# Patient Record
Sex: Male | Born: 1960
Health system: Southern US, Community
[De-identification: ages and names within clinical notes are randomized; demographics above are authoritative.]

## PROBLEM LIST (undated history)

## (undated) DIAGNOSIS — E782 Mixed hyperlipidemia: Secondary | ICD-10-CM

## (undated) DIAGNOSIS — A630 Anogenital (venereal) warts: Secondary | ICD-10-CM

## (undated) DIAGNOSIS — M51369 Other intervertebral disc degeneration, lumbar region without mention of lumbar back pain or lower extremity pain: Secondary | ICD-10-CM

## (undated) DIAGNOSIS — M5136 Other intervertebral disc degeneration, lumbar region: Secondary | ICD-10-CM

## (undated) DIAGNOSIS — Z973 Presence of spectacles and contact lenses: Secondary | ICD-10-CM

## (undated) DIAGNOSIS — E291 Testicular hypofunction: Secondary | ICD-10-CM

## (undated) DIAGNOSIS — I10 Essential (primary) hypertension: Secondary | ICD-10-CM

## (undated) DIAGNOSIS — E559 Vitamin D deficiency, unspecified: Secondary | ICD-10-CM

## (undated) HISTORY — PX: INGUINAL HERNIA REPAIR: SUR1180

## (undated) HISTORY — DX: Essential (primary) hypertension: I10

---

## 2001-10-15 ENCOUNTER — Encounter: Payer: Self-pay | Admitting: Internal Medicine

## 2001-10-15 ENCOUNTER — Ambulatory Visit (HOSPITAL_COMMUNITY): Admission: RE | Admit: 2001-10-15 | Discharge: 2001-10-15 | Payer: Self-pay | Admitting: Internal Medicine

## 2001-12-05 ENCOUNTER — Encounter: Admission: RE | Admit: 2001-12-05 | Discharge: 2001-12-05 | Payer: Self-pay | Admitting: Neurosurgery

## 2001-12-18 ENCOUNTER — Encounter: Admission: RE | Admit: 2001-12-18 | Discharge: 2001-12-18 | Payer: Self-pay | Admitting: Neurosurgery

## 2003-06-12 ENCOUNTER — Ambulatory Visit (HOSPITAL_COMMUNITY): Admission: RE | Admit: 2003-06-12 | Discharge: 2003-06-12 | Payer: Self-pay | Admitting: Internal Medicine

## 2004-05-14 ENCOUNTER — Encounter: Admission: RE | Admit: 2004-05-14 | Discharge: 2004-05-14 | Payer: Self-pay | Admitting: Neurosurgery

## 2004-07-09 ENCOUNTER — Encounter: Admission: RE | Admit: 2004-07-09 | Discharge: 2004-07-09 | Payer: Self-pay | Admitting: Neurosurgery

## 2004-11-10 ENCOUNTER — Encounter: Admission: RE | Admit: 2004-11-10 | Discharge: 2004-11-10 | Payer: Self-pay | Admitting: Neurosurgery

## 2004-11-30 ENCOUNTER — Encounter: Admission: RE | Admit: 2004-11-30 | Discharge: 2004-11-30 | Payer: Self-pay | Admitting: Neurosurgery

## 2004-12-29 ENCOUNTER — Ambulatory Visit (HOSPITAL_COMMUNITY): Admission: RE | Admit: 2004-12-29 | Discharge: 2004-12-30 | Payer: Self-pay | Admitting: Neurosurgery

## 2004-12-29 HISTORY — PX: LUMBAR MICRODISCECTOMY: SHX99

## 2007-01-08 ENCOUNTER — Ambulatory Visit (HOSPITAL_COMMUNITY): Admission: RE | Admit: 2007-01-08 | Discharge: 2007-01-08 | Payer: Self-pay | Admitting: Internal Medicine

## 2007-10-06 IMAGING — US US ABDOMEN COMPLETE
1 series · 14 of 25 positions shown · non-contrast
Comparison: none

CLINICAL DATA: Elevated liver function tests.  
 ABDOMEN ULTRASOUND:
TECHNIQUE: Complete abdominal ultrasound examination was performed including evaluation of the liver, gallbladder, bile ducts, pancreas, kidneys, spleen, IVC, and abdominal aorta.

[Series 1: us abdomen complete · 14 of 74 slices shown]
[im 1/74]
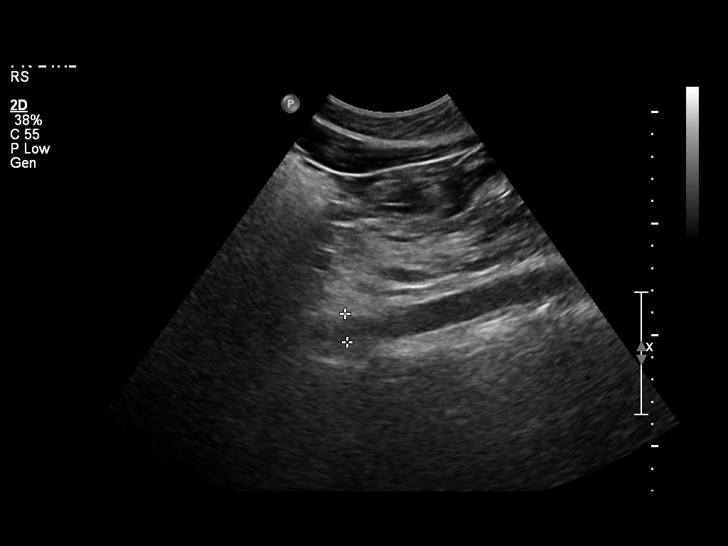
[im 7/74]
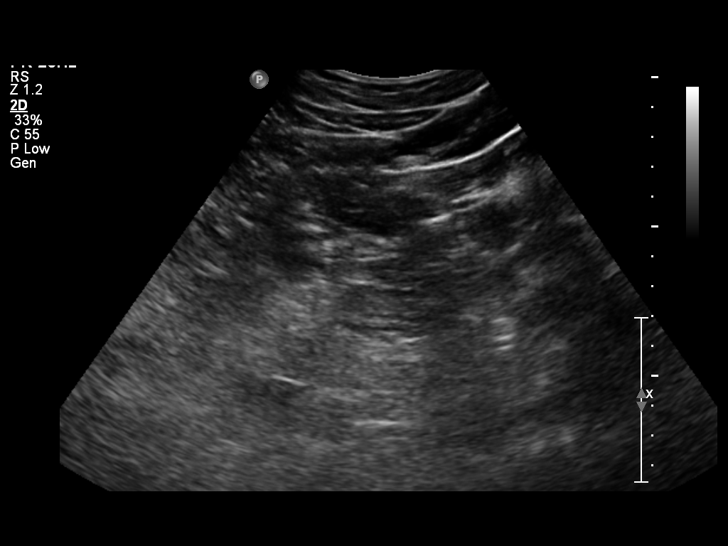
[im 13/74]
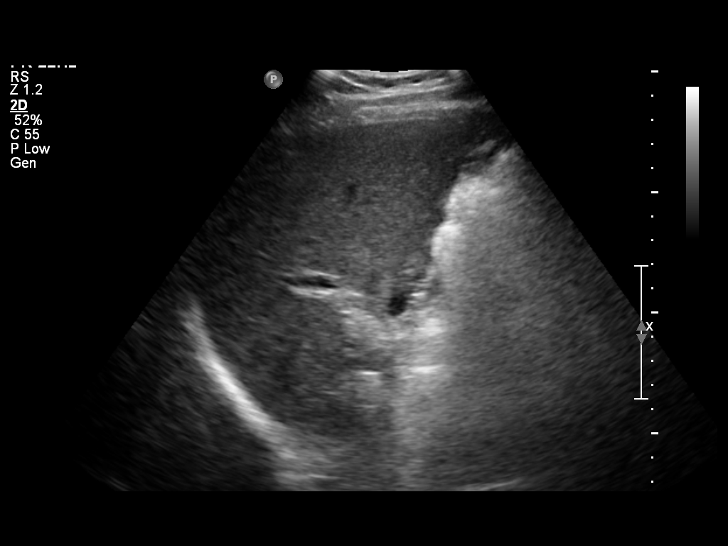
[im 19/74]
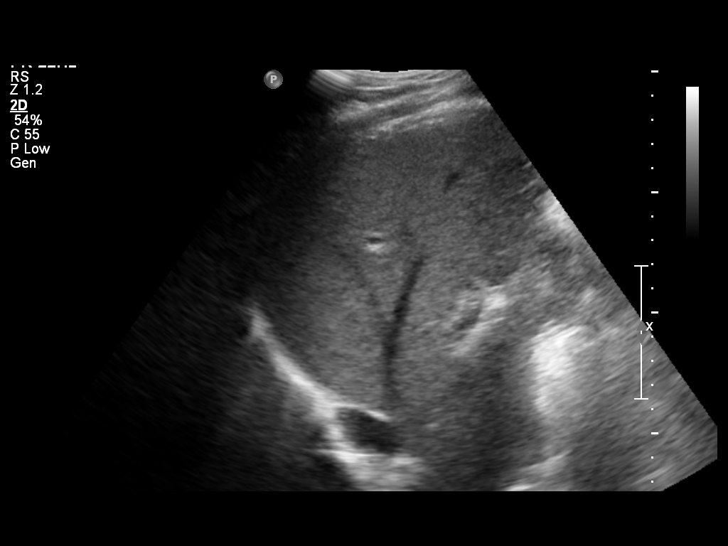
[im 25/74]
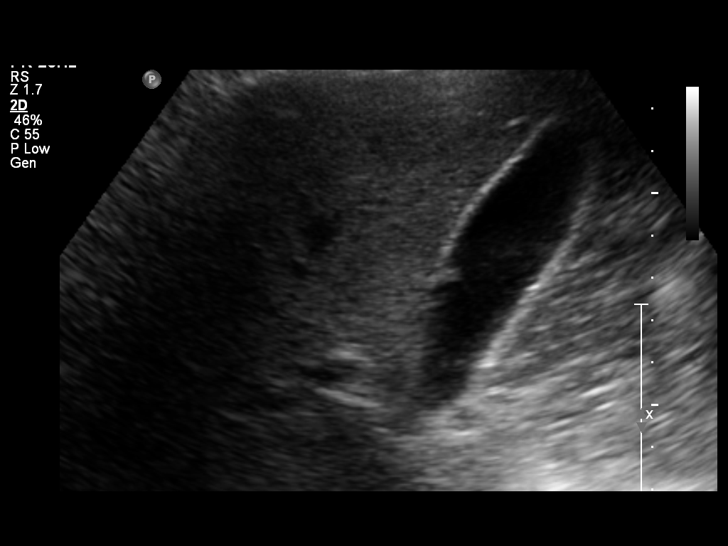
[im 28/74]
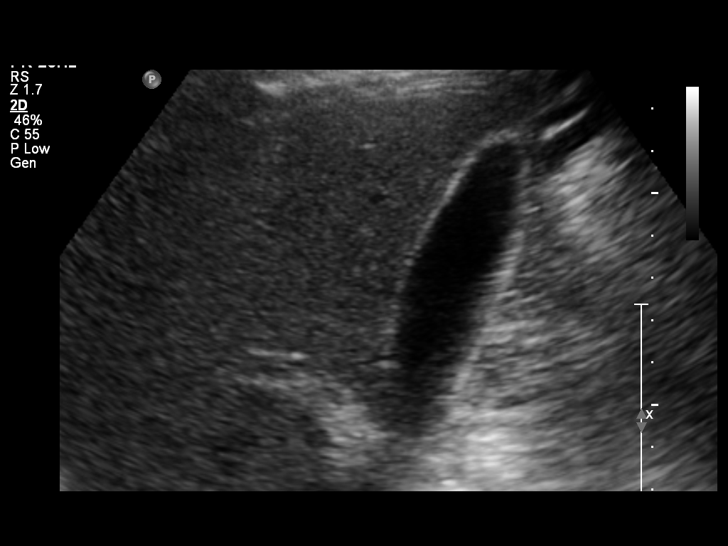
[im 34/74]
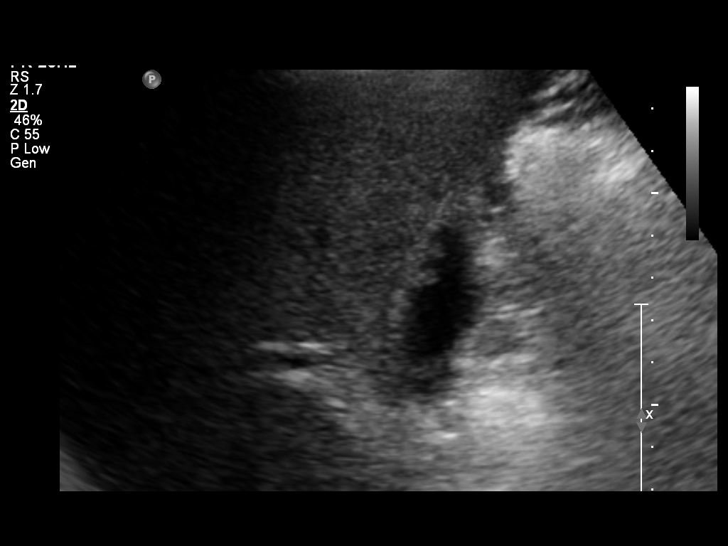
[im 40/74]
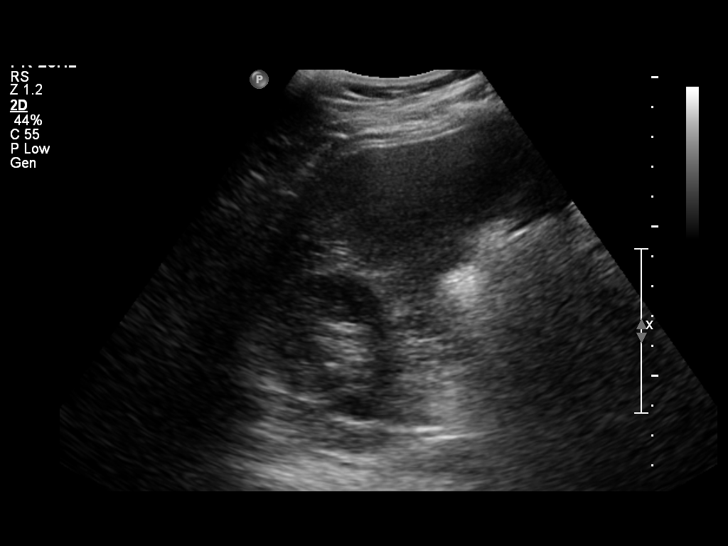
[im 46/74]
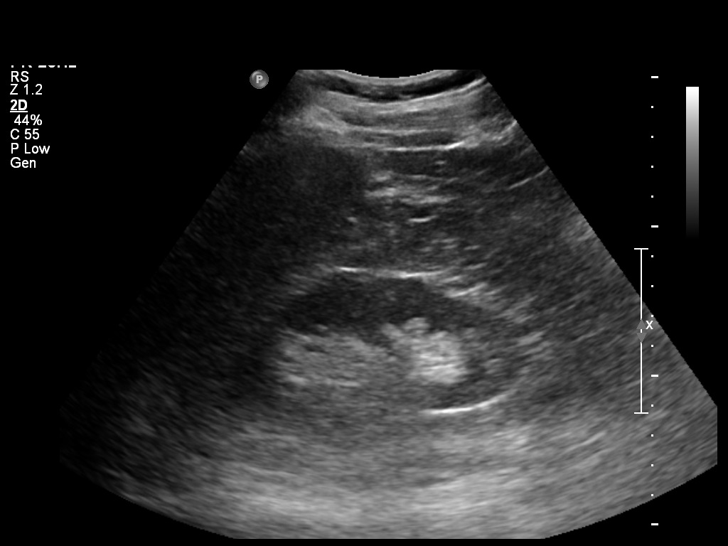
[im 49/74]
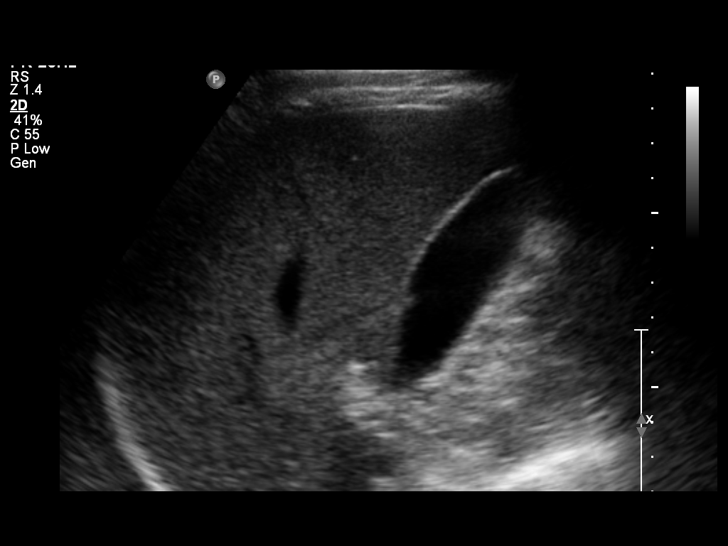
[im 55/74]
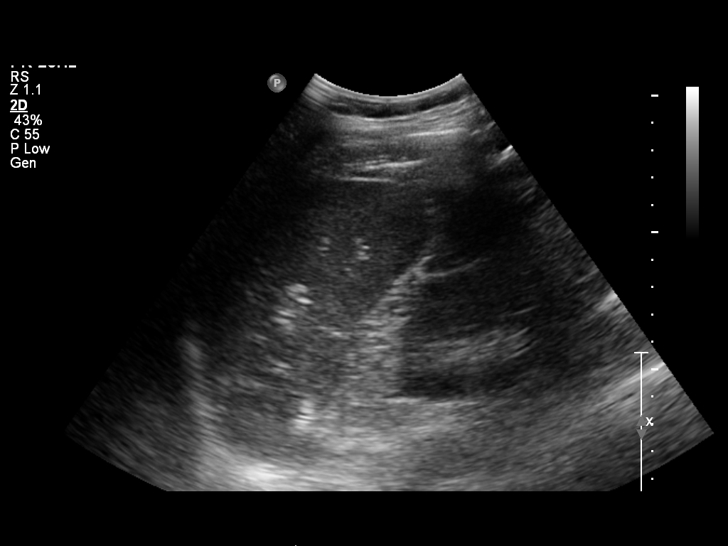
[im 61/74]
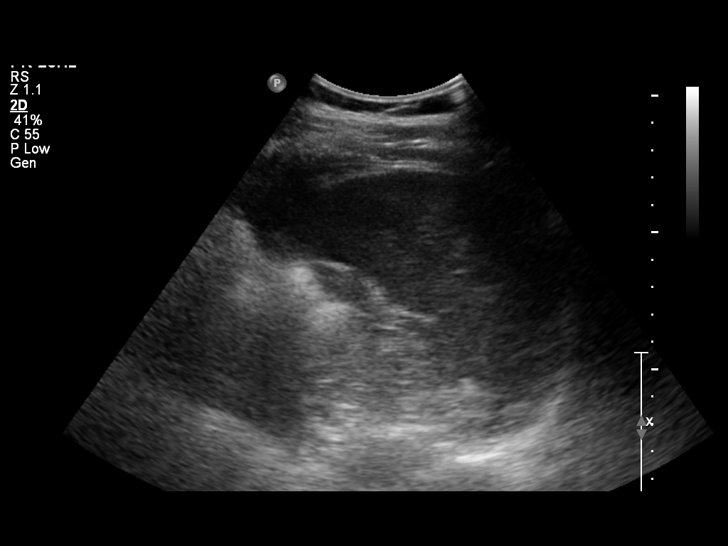
[im 67/74]
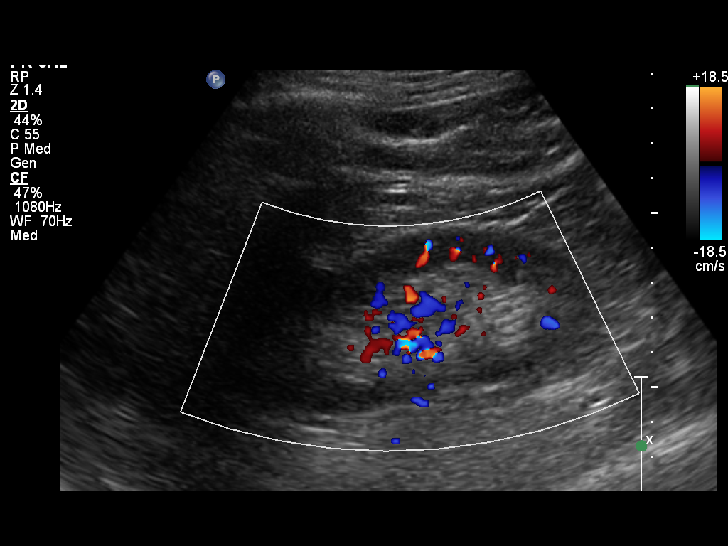
[im 74/74]
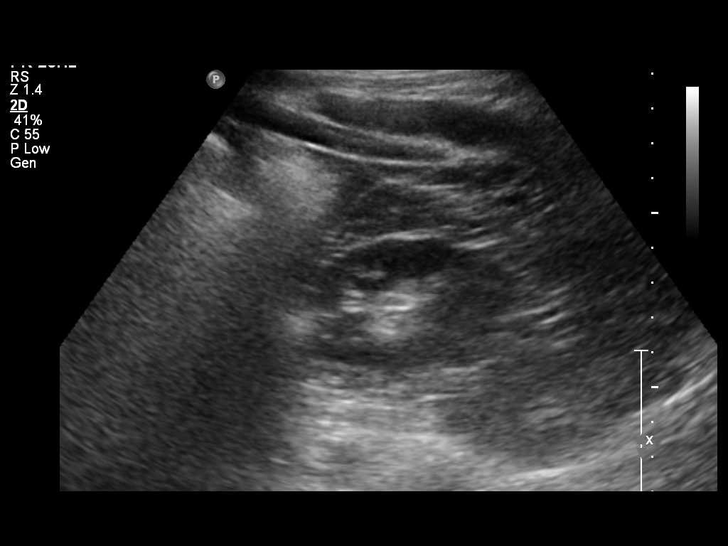

[14 of 25 positions shown; findings below may reference images not displayed]

FINDINGS: There is no evidence of gallstones or gallbladder wall thickening. Two tiny cholesterol polyps are seen along the nondependent gallbladder wall measuring 2-3 mm.  There is no evidence of biliary ductal dilatation with the common bile duct measuring 2 mm.  The liver is normal in parenchymal echogenicity and no focal liver lesions are identified.  
 The visualized portion of the IVC is unremarkable but the pancreas is obscured by overlying bowel gas.  There is no evidence of splenomegaly.  Both kidneys are normal in size and appearance and there is no evidence of hydronephrosis.  There is no evidence of abdominal aortic aneurysm or ascites.
IMPRESSION: 1.  Normal sonographic appearance of the liver.  No evidence of biliary dilatation.
 2.  No evidence of gallstones.

## 2010-08-21 ENCOUNTER — Encounter: Payer: Self-pay | Admitting: Internal Medicine

## 2010-08-23 ENCOUNTER — Encounter: Payer: Self-pay | Admitting: Internal Medicine

## 2010-12-17 NOTE — Op Note (Signed)
Philip Montgomery, Philip Montgomery                  ACCOUNT NO.:  [id number]   MEDICAL RECORD NO.:  [id number]          PATIENT TYPE:  OIB   LOCATION:  3001                         FACILITY:  [facility]   PHYSICIAN:  Cristi Loron, M.D.DATE OF BIRTH:  [birth year]   DATE OF PROCEDURE:  12/29/2004  DATE OF DISCHARGE:                                 OPERATIVE REPORT   BRIEF HISTORY:  The patient is a 50 year old white male who suffers from  chronic back pain, more recently he has developed severe right leg pain  consistent with a right L5 radiculopathy.  He was worked up with a lumbar  MRI which demonstrated herniated disc at L4-L5 on the right.  I discussed  the various treatments with the patient including surgery.  The patient has  weighed the risks, benefits, and alternatives of surgery and decided to  proceed with right L4-L5 microdiscectomy.   PREOPERATIVE DIAGNOSIS:  Right L4-L5 herniated nucleus pulposus,  degenerative disease, lumbar radiculopathy, lumbago, lumbar stenosis.   POSTOPERATIVE DIAGNOSIS:  Right L4-L5 herniated nucleus pulposus,  degenerative disease, lumbar radiculopathy, lumbago, lumbar stenosis.   PROCEDURE:  Right L4-L5 microdiscectomy using microdissection.   SURGEON:  Cristi Loron, M.D.   ASSISTANT:  Payton Doughty, M.D.   ANESTHESIA:  General endotracheal anesthesia.   ESTIMATED BLOOD LOSS:  Minimal.   SPECIMENS:  None.   DRAINS:  None.   COMPLICATIONS:  None.   DESCRIPTION OF PROCEDURE:  The patient was brought to the operating room by  the anesthesia team.  General endotracheal anesthesia was induced.  The  patient was then turned to the prone position on the Wilson frame.  His  lumbosacral  region was then prepared with Betadine scrub and Betadine  solution.  Sterile drapes were applied.  I then injected the area to be  incised with Marcaine with epinephrine solution.  I used a scalpel to make a  linear midline incision over the L4-L5 interspace.  I used  electrocautery to  perform a subperiosteal dissection exposing the right spinous process and  lamina of L4 and L5.  I obtained interoperative radiograph to confirm our  location and then inserted a McCullough retractor for exposure.   We then brought the operation microscope onto the field and under  magnification and illumination, we completed the  microdissection/decompression.  We used the high speed drill to perform a  right L4 laminotomy.  We widened the laminotomy with a Kerrison punch and  removed the right L4-L5 ligamentum flavum.  We then performed a foraminotomy  about the right L5 nerve root.  We then used the microdissection to free up  the nerve root from the epidural tissue and then Dr. Channing Mutters gently retracted  the nerve root and thecal sac medially.  This exposed a free fragment disc  herniation.  We freed up the disc herniation from epidural tissue and  removed it with pituitary forceps.  I inspected the intervertebral disc at  L4-L5.  There was considerable bulging and ventral compression of the thecal  sac at the disc space.  We, therefore, incised the annulus  fibrosis and  performed a partial intervertebral discectomy using pituitary forceps and  Scoville curets.  After we were satisfied with intervertebral discectomy, I  used the osteophyte tools to remove some redundant ligament at the vertebral  endplates at L4-L5 further decompressing the L5 nerve root and the thecal  sac.  I then obtained stringent hemostasis using bipolar electrocautery.  We  palpated along the ventral surface of the thecal sac and the exit route L5  nerve root and noted the neural structures were well decompressed.  We then  copiously irrigated the wound out with Bacitracin solution and removed the  solution.  We then removed the [facility] retractor.  We then reapproximated  the patient's thoracolumbar fascia with interrupted #1 Vicryl suture, the  subcutaneous tissue with interrupted 2-0 Vicryl  suture, and the skin with  Steri-Strips and Benzoin.  The wound was then coated with Bacitracin  ointment.  A sterile dressing was applied, the drapes were removed.  The  patient was subsequently returned to the supine position where he was  extubated by the anesthesia team and transported to the post anesthesia care  unit in stable condition.  All sponge, instrument and needle counts were  correct at the end of the case.       JDJ/MEDQ  D:  12/29/2004  T:  12/29/2004  Job:  [id number]

## 2014-08-13 ENCOUNTER — Ambulatory Visit (INDEPENDENT_AMBULATORY_CARE_PROVIDER_SITE_OTHER): Payer: BLUE CROSS/BLUE SHIELD | Admitting: Physician Assistant

## 2014-08-13 ENCOUNTER — Encounter: Payer: Self-pay | Admitting: Physician Assistant

## 2014-08-13 VITALS — BP 110/68 | HR 76 | Temp 97.7°F | Resp 16 | Ht 66.0 in | Wt 171.0 lb

## 2014-08-13 DIAGNOSIS — N182 Chronic kidney disease, stage 2 (mild): Secondary | ICD-10-CM | POA: Insufficient documentation

## 2014-08-13 DIAGNOSIS — N181 Chronic kidney disease, stage 1: Secondary | ICD-10-CM

## 2014-08-13 DIAGNOSIS — E782 Mixed hyperlipidemia: Secondary | ICD-10-CM | POA: Insufficient documentation

## 2014-08-13 DIAGNOSIS — E785 Hyperlipidemia, unspecified: Secondary | ICD-10-CM

## 2014-08-13 DIAGNOSIS — I10 Essential (primary) hypertension: Secondary | ICD-10-CM

## 2014-08-13 DIAGNOSIS — G4700 Insomnia, unspecified: Secondary | ICD-10-CM

## 2014-08-13 DIAGNOSIS — B009 Herpesviral infection, unspecified: Secondary | ICD-10-CM

## 2014-08-13 MED ORDER — HYDROCORTISONE ACETATE 25 MG RE SUPP: 25.0000 mg | Freq: Two times a day (BID) | RECTAL | Status: DC

## 2014-08-13 NOTE — Patient Instructions (Addendum)
Benefiber is good for constipation/diarrhea/irritable bowel syndrome, it helps with weight loss and can help lower your bad cholesterol. Please do 1-2 TBSP in the morning in water, coffee, or tea. It can take up to a month before you can see a difference with your bowel movements. It is cheapest from [employer], [employer], [employer].   Your LDL is not in range. Your LDL is the bad cholesterol that can lead to heart attack and stroke. To lower your number you can decrease your fatty foods, red meat, cheese, milk and increase fiber like whole grains and veggies. You can also add a fiber supplement like Metamucil or Benefiber.   Call Dr. Kinnie ScalesMedoff to ask about colonoscopy Phone: [phone]      Bad carbs also include fruit juice, alcohol, and sweet tea. These are empty calories that do not signal to your brain that you are full.   Please remember the good carbs are still carbs which convert into sugar. So please measure them out no more than 1/2-1 cup of rice, oatmeal, pasta, and beans  Veggies are however free foods! Pile them on.   Not all fruit is created equal. Please see the list below, the fruit at the bottom is higher in sugars than the fruit at the top. Please avoid all dried fruits.

## 2014-08-13 NOTE — Progress Notes (Signed)
Assessment and Plan:   Hypertension: Continue medication, monitor blood pressure at home. Continue DASH diet.  Reminder to go to the ER if any CP, SOB, nausea, dizziness, severe HA, changes vision/speech, left arm numbness and tingling, and jaw pain. Cholesterol: Continue diet and exercise. Check cholesterol.   Pre-diabetes-Continue diet and exercise. Check A1C Vitamin D Def- check level and continue medications.  Insomnia- cont ambien,good sleep hygiene discussed, increase day time activity Constipation- Increase fiber/ water intake, decrease caffeine, increase activity level, can add benefiber until BM soft, supp sent in. Laboratory tests per orders. Please go to the hospital if you have severe abdominal pain, vomiting, fever, CP, SOB. Follow up Dr. Kinnie ScalesMedoff.  ? BPH symptoms- does not want meds at this time, will recheck PSA in Nov.   Continue diet and meds as discussed. Will follow up in 3 months to recheck labs.    HPI 54 y.o. male  presents for 3 month follow up with hypertension, hyperlipidemia, prediabetes and vitamin D. His blood pressure has been controlled at home, lisinopril, today their BP is BP: 110/68 mmHg He does not workout due to the cold weather. He denies chest pain, shortness of breath, dizziness.   He is on cholesterol medication, crestor 20mg  QD and denies myalgias. His cholesterol is at goal. The cholesterol last checked 06/11/2014: LDL 88, Trig 186, HDL 37.    He has been working on diet and exercise for prediabetes, and denies paresthesia of the feet, polydipsia, polyuria and visual disturbances. Last A1C in the office was: 5.6.   Patient is on Vitamin D supplement.   He is on ambien 12.5 CR and he would like to get on ambien 5mg  to take 1/2-1 at night.   His PSA was 2.1 in 06/2014, he has some increased frequency with urination and nocturia x1-2, denies hesitancy, dribbling.   Has has previous back surgeries and uses norco occ if he has back pan.   Has hemorrhoids for  last 2-3 weeks, not painful, Last colonoscopy 2004 with Dr. Kinnie ScalesMedoff, over due Has ED, not interested as treatment, can not have erection.   BMI is Body mass index is 27.61 kg/(m^2)., he is working on diet and exercise. Wt Readings from Last 3 Encounters:   08/13/14  171 lb (77.565 kg)      Current Medications:       lisinopril 10 MG tablet   Commonly known as:  PRINIVIL,ZESTRIL   Take 10 mg by mouth daily.         MULTIVITAMIN PO   Take by mouth.         rosuvastatin 20 MG tablet   Commonly known as:  CRESTOR   Take 20 mg by mouth daily.         valACYclovir 500 MG tablet   Commonly known as:  VALTREX   Take 500 mg by mouth daily.         zolpidem 12.5 MG CR tablet   Commonly known as:  AMBIEN CR   Take 12.5 mg by mouth at bedtime as needed for sleep.      Medical History:  Past Medical History  Diagnosis Date  . Hyperlipidemia   . Hypertension   . Chronic kidney disease   . Insomnia   . HSV-1 (herpes simplex virus 1) infection     Allergies: No Known Allergies    Review of Systems:   ROS- See HPI  Family history- Review and unchanged Social history- Review and unchanged Physical Exam: BP 110/68  mmHg  Pulse 76  Temp(Src) 97.7 F (36.5 C)  Resp 16  Ht  (1.676 m)  Wt 171 lb (77.565 kg)  BMI 27.61 kg/m2 Wt Readings from Last 3 Encounters:   08/13/14  171 lb (77.565 kg)    General Appearance: Well nourished, in no apparent distress. Eyes: PERRLA, EOMs, conjunctiva no swelling or erythema Sinuses: No Frontal/maxillary tenderness ENT/Mouth: Ext aud canals clear, TMs without erythema, bulging. No erythema, swelling, or exudate on post pharynx.  Tonsils not swollen or erythematous. Hearing normal.   Neck: Supple, thyroid normal.   Respiratory: Respiratory effort normal, BS equal bilaterally without rales, rhonchi, wheezing or stridor.   Cardio: RRR with no MRGs. Brisk peripheral pulses without edema.   Abdomen: Soft, + BS.  Non tender, no guarding,  rebound, hernias, masses. Lymphatics: Non tender without lymphadenopathy.   Musculoskeletal: Full ROM, 5/5 strength, normal gait.   Skin: Warm, dry without rashes, lesions, ecchymosis.   Neuro: Cranial nerves intact. Normal muscle tone, no cerebellar symptoms. Sensation intact.   Psych: Awake and oriented X 3, normal affect, Insight and Judgment appropriate.      Philip Mulling, PA-C 12:16 PM [facility]

## 2014-08-15 ENCOUNTER — Encounter: Payer: Self-pay | Admitting: Physician Assistant

## 2014-09-09 ENCOUNTER — Other Ambulatory Visit: Payer: Self-pay | Admitting: Physician Assistant

## 2014-09-09 MED ORDER — ZOLPIDEM TARTRATE 5 MG PO TABS: 5.0000 mg | ORAL_TABLET | Freq: Every evening | ORAL | Status: DC | PRN

## 2014-09-20 ENCOUNTER — Encounter: Payer: Self-pay | Admitting: *Deleted

## 2014-10-13 ENCOUNTER — Other Ambulatory Visit: Payer: Self-pay | Admitting: Physician Assistant

## 2014-12-10 ENCOUNTER — Ambulatory Visit (INDEPENDENT_AMBULATORY_CARE_PROVIDER_SITE_OTHER): Payer: BLUE CROSS/BLUE SHIELD | Admitting: Internal Medicine

## 2014-12-10 ENCOUNTER — Encounter: Payer: Self-pay | Admitting: Internal Medicine

## 2014-12-10 ENCOUNTER — Ambulatory Visit: Payer: Self-pay | Admitting: Physician Assistant

## 2014-12-10 ENCOUNTER — Other Ambulatory Visit: Payer: Self-pay

## 2014-12-10 VITALS — BP 112/78 | HR 88 | Temp 97.7°F | Resp 16 | Ht 66.0 in | Wt 168.0 lb

## 2014-12-10 DIAGNOSIS — Z79899 Other long term (current) drug therapy: Secondary | ICD-10-CM

## 2014-12-10 DIAGNOSIS — R7309 Other abnormal glucose: Secondary | ICD-10-CM

## 2014-12-10 DIAGNOSIS — E785 Hyperlipidemia, unspecified: Secondary | ICD-10-CM

## 2014-12-10 DIAGNOSIS — E559 Vitamin D deficiency, unspecified: Secondary | ICD-10-CM

## 2014-12-10 DIAGNOSIS — N181 Chronic kidney disease, stage 1: Secondary | ICD-10-CM

## 2014-12-10 DIAGNOSIS — R7303 Prediabetes: Secondary | ICD-10-CM

## 2014-12-10 DIAGNOSIS — I10 Essential (primary) hypertension: Secondary | ICD-10-CM

## 2014-12-10 LAB — LIPID PANEL
CHOLESTEROL: 170 mg/dL (ref 0–200)
HDL: 39 mg/dL — ABNORMAL LOW (ref >=40)
LDL Cholesterol: 89 mg/dL (ref 0–99)
TRIGLYCERIDES: 212 mg/dL — AB (ref <150)
Total CHOL/HDL Ratio: 4.4 Ratio
VLDL: 42 mg/dL — AB (ref 0–40)

## 2014-12-10 LAB — CBC WITH DIFFERENTIAL/PLATELET
BASOS ABS: 0 10*3/uL (ref 0.0–0.1)
BASOS PCT: 0 % (ref 0–1)
EOS PCT: 5 % (ref 0–5)
Eosinophils Absolute: 0.5 10*3/uL (ref 0.0–0.7)
HEMATOCRIT: 49.9 % (ref 39.0–52.0)
HEMOGLOBIN: 17.2 g/dL — AB (ref 13.0–17.0)
LYMPHS PCT: 26 % (ref 12–46)
Lymphs Abs: 2.4 10*3/uL (ref 0.7–4.0)
MCH: 32.2 pg (ref 26.0–34.0)
MCHC: 34.5 g/dL (ref 30.0–36.0)
MCV: 93.4 fL (ref 78.0–100.0)
MONO ABS: 0.8 10*3/uL (ref 0.1–1.0)
MONOS PCT: 9 % (ref 3–12)
MPV: 10.2 fL (ref 8.6–12.4)
Neutro Abs: 5.5 10*3/uL (ref 1.7–7.7)
Neutrophils Relative %: 60 % (ref 43–77)
PLATELETS: 238 10*3/uL (ref 150–400)
RBC: 5.34 MIL/uL (ref 4.22–5.81)
RDW: 13.2 % (ref 11.5–15.5)
WBC: 9.1 10*3/uL (ref 4.0–10.5)

## 2014-12-10 LAB — HEPATIC FUNCTION PANEL
ALK PHOS: 59 U/L (ref 39–117)
ALT: 39 U/L (ref 0–53)
AST: 22 U/L (ref 0–37)
Albumin: 4.5 g/dL (ref 3.5–5.2)
BILIRUBIN DIRECT: 0.1 mg/dL (ref 0.0–0.3)
BILIRUBIN TOTAL: 0.6 mg/dL (ref 0.2–1.2)
Indirect Bilirubin: 0.5 mg/dL (ref 0.2–1.2)
Total Protein: 6.8 g/dL (ref 6.0–8.3)

## 2014-12-10 LAB — HEMOGLOBIN A1C
Hgb A1c MFr Bld: 5.5 % (ref <5.7)
Mean Plasma Glucose: 111 mg/dL (ref <117)

## 2014-12-10 LAB — BASIC METABOLIC PANEL WITH GFR
BUN: 14 mg/dL (ref 6–23)
CO2: 28 meq/L (ref 19–32)
Calcium: 10.2 mg/dL (ref 8.4–10.5)
Chloride: 101 mEq/L (ref 96–112)
Creat: 1.04 mg/dL (ref 0.50–1.35)
GFR, EST NON AFRICAN AMERICAN: 80 mL/min
GLUCOSE: 90 mg/dL (ref 70–99)
Potassium: 4.4 mEq/L (ref 3.5–5.3)
SODIUM: 140 meq/L (ref 135–145)

## 2014-12-10 LAB — MAGNESIUM: MAGNESIUM: 2 mg/dL (ref 1.5–2.5)

## 2014-12-10 MED ORDER — ZOLPIDEM TARTRATE 5 MG PO TABS: 5.0000 mg | ORAL_TABLET | Freq: Every evening | ORAL | Status: DC | PRN

## 2014-12-10 MED ORDER — TRAMADOL HCL 50 MG PO TABS: 50.0000 mg | ORAL_TABLET | Freq: Four times a day (QID) | ORAL | Status: DC | PRN

## 2014-12-10 MED ORDER — LISINOPRIL 10 MG PO TABS: 10.0000 mg | ORAL_TABLET | Freq: Every day | ORAL | Status: DC

## 2014-12-10 MED ORDER — MELOXICAM 7.5 MG PO TABS: 7.5000 mg | ORAL_TABLET | Freq: Every day | ORAL | Status: DC

## 2014-12-10 MED ORDER — ROSUVASTATIN CALCIUM 20 MG PO TABS: 20.0000 mg | ORAL_TABLET | Freq: Every day | ORAL | Status: DC

## 2014-12-10 NOTE — Progress Notes (Signed)
Patient ID: Philip Montgomery, male   DOB: 1960-08-09, 54 y.o.   MRN: [id number]  Assessment and Plan:  Hypertension:  -Continue medication,  -monitor blood pressure at home.  -Continue DASH diet.   -Reminder to go to the ER if any CP, SOB, nausea, dizziness, severe HA, changes vision/speech, left arm numbness and tingling, and jaw pain.  Cholesterol: -Continue diet and exercise.  -Check cholesterol.   Pre-diabetes: -Continue diet and exercise.  -Check A1C  Vitamin D Def: -check level -continue medications.   Back pain -likely muscle strain vs. Muscle spasm vs. Arthritis -ultram and mobic prn   Continue diet and meds as discussed. Further disposition pending results of labs.  HPI  Patient recently moved back to [place] from [place].  He reports that he is doing all right, but he is concerned because after standing for some time he had a lot of discomfort around his waist and his genitals.  He still has an aching discomfort which is dull and minor.  This started after standing for 8 hours straight.  He has been doing a lot more stretching and is [place]  He does have a history of hernia repairs.  He has not noticed any bulging. The most recent surgery was 2 years ago.    54 y.o. male  presents for 3 month follow up with hypertension, hyperlipidemia, prediabetes and vitamin D.   His blood pressure has been controlled at home, today their BP is BP: 112/78 mmHg.   He does workout.  He is doing a lot of walking.   He denies chest pain, shortness of breath, dizziness.   He is on cholesterol medication and denies myalgias. His cholesterol is at goal. The cholesterol last visit was:  No results found for: CHOL, HDL, LDLCALC, LDLDIRECT, TRIG, CHOLHDL   He has been working on diet and exercise for prediabetes, and denies foot ulcerations, hyperglycemia, hypoglycemia , increased appetite, nausea, paresthesia of the feet, polydipsia, polyuria, visual  disturbances, vomiting and weight loss. Last A1C in the office was: No results found for: HGBA1C  Patient is on Vitamin D supplement. No results found for: VD25OH    Current Medications:  Current Outpatient Prescriptions on File Prior to Visit  Medication Sig Dispense Refill  . hydrocortisone (ANUSOL-HC) 25 MG suppository Place 1 suppository (25 mg total) rectally 2 (two) times daily. 12 suppository 0  . Multiple Vitamins-Minerals (MULTIVITAMIN PO) Take by mouth.    . valACYclovir (VALTREX) 500 MG tablet Take 500 mg by mouth daily.    Marland Kitchen. zolpidem (AMBIEN) 5 MG tablet TAKE 1 TABLET BY MOUTH AT BEDTIME AS NEEDED FOR SLEEP 30 tablet 2   No current facility-administered medications on file prior to visit.    Medical History:  Past Medical History  Diagnosis Date  . Hyperlipidemia   . Hypertension   . Chronic kidney disease   . Insomnia   . HSV-1 (herpes simplex virus 1) infection   . Chronic lower back pain     Allergies: No Known Allergies   Review of Systems:  Review of Systems  Constitutional: Negative for fever, chills and malaise/fatigue.  HENT: Negative for congestion, ear discharge, ear pain, sore throat and tinnitus.   Eyes: Negative.   Respiratory: Negative for cough, shortness of breath and wheezing.   Cardiovascular: Negative for chest pain, palpitations and leg swelling.  Gastrointestinal: Negative for heartburn, nausea, vomiting, diarrhea, constipation, blood in stool and melena.  Genitourinary: Negative for dysuria, urgency  and frequency.  Musculoskeletal: Negative.   Skin: Negative.   Neurological: Negative for dizziness, sensory change, loss of consciousness and headaches.  Psychiatric/Behavioral: Negative for depression. The patient has insomnia. The patient is not nervous/anxious.     Family history- Review and unchanged  Social history- Review and unchanged  Physical Exam: BP 112/78 mmHg  Pulse 88  Temp(Src) 97.7 F (36.5 C)  Resp 16  Ht 5\' 6"  (1.676  m)  Wt 168 lb (76.204 kg)  BMI 27.13 kg/m2 Wt Readings from Last 3 Encounters:  12/10/14 168 lb (76.204 kg)  08/13/14 171 lb (77.565 kg)    General Appearance: Well nourished well developed, in no apparent distress. Eyes: PERRLA, EOMs, conjunctiva no swelling or erythema ENT/Mouth: Ear canals normal without obstruction, swelling, erythma, discharge.  TMs normal bilaterally.  Oropharynx moist, clear, without exudate, or postoropharyngeal swelling. Neck: Supple, thyroid normal,no cervical adenopathy  Respiratory: Respiratory effort normal, Breath sounds clear A&P without rhonchi, wheeze, or rale.  No retractions, no accessory usage. Cardio: RRR with no MRGs. Brisk peripheral pulses without edema.  Abdomen: Soft, + BS,  Non tender, no guarding, rebound, hernias, masses. GU:  No inguinal hernias Musculoskeletal: Full ROM, 5/5 strength, Normal gait, bilateral low back pain Skin: Warm, dry without rashes, lesions, ecchymosis.  Neuro: Awake and oriented X 3, Cranial nerves intact. Normal muscle tone, no cerebellar symptoms. Psych: Normal affect, Insight and Judgment appropriate.    FORCUCCI, Eeva Schlosser, PA-C 8:51 AM [facility]

## 2014-12-11 LAB — INSULIN, RANDOM: INSULIN: 20 u[IU]/mL — AB (ref 2.0–19.6)

## 2014-12-15 LAB — VITAMIN D 1,25 DIHYDROXY
VITAMIN D3 1, 25 (OH): 42 pg/mL
Vitamin D 1, 25 (OH)2 Total: 42 pg/mL (ref 18–72)

## 2015-03-03 ENCOUNTER — Other Ambulatory Visit: Payer: Self-pay | Admitting: Internal Medicine

## 2015-03-03 ENCOUNTER — Ambulatory Visit (INDEPENDENT_AMBULATORY_CARE_PROVIDER_SITE_OTHER): Payer: BLUE CROSS/BLUE SHIELD | Admitting: Internal Medicine

## 2015-03-03 ENCOUNTER — Encounter: Payer: Self-pay | Admitting: Internal Medicine

## 2015-03-03 VITALS — BP 128/80 | HR 84 | Temp 98.2°F | Resp 18 | Ht 66.0 in | Wt 167.0 lb

## 2015-03-03 DIAGNOSIS — I10 Essential (primary) hypertension: Secondary | ICD-10-CM | POA: Diagnosis not present

## 2015-03-03 DIAGNOSIS — B009 Herpesviral infection, unspecified: Secondary | ICD-10-CM

## 2015-03-03 DIAGNOSIS — E785 Hyperlipidemia, unspecified: Secondary | ICD-10-CM | POA: Diagnosis not present

## 2015-03-03 DIAGNOSIS — G4700 Insomnia, unspecified: Secondary | ICD-10-CM

## 2015-03-03 DIAGNOSIS — J309 Allergic rhinitis, unspecified: Secondary | ICD-10-CM | POA: Diagnosis not present

## 2015-03-03 MED ORDER — PREDNISONE 20 MG PO TABS: ORAL_TABLET | ORAL | Status: DC

## 2015-03-03 MED ORDER — LORAZEPAM 1 MG PO TABS: 1.0000 mg | ORAL_TABLET | Freq: Every day | ORAL | Status: DC

## 2015-03-03 MED ORDER — VALACYCLOVIR HCL 1 G PO TABS: 1000.0000 mg | ORAL_TABLET | Freq: Two times a day (BID) | ORAL | Status: DC

## 2015-03-03 NOTE — Patient Instructions (Signed)
Tinnitus °Sounds you hear in your ears and coming from within the ear is called tinnitus. This can be a symptom of many ear disorders. It is often associated with hearing loss.  °Tinnitus can be seen with: °· Infections. °· Ear blockages such as wax buildup. °· Meniere's disease. °· Ear damage. °· Inherited. °· Occupational causes. °While irritating, it is not usually a threat to health. When the cause of the tinnitus is wax, infection in the middle ear, or foreign body it is easily treated. Hearing loss will usually be reversible.  °TREATMENT  °When treating the underlying cause does not get rid of tinnitus, it may be necessary to get rid of the unwanted sound by covering it up with more pleasant background noises. This may include music, the radio etc. There are tinnitus maskers which can be worn which produce background noise to cover up the tinnitus. °Avoid all medications which tend to make tinnitus worse such as alcohol, caffeine, aspirin, and nicotine. There are many soothing background tapes such as rain, ocean, thunderstorms, etc. These soothing sounds help with sleeping or resting. °Keep all follow-up appointments and referrals. This is important to identify the cause of the problem. It also helps avoid complications, impaired hearing, disability, or chronic pain. °Document Released: 07/18/2005 Document Revised: 10/10/2011 Document Reviewed: 03/05/2008 °[employer]® Patient Information ©2015 [employer]. This information is not intended to replace advice given to you by your health care provider. Make sure you discuss any questions you have with your health care provider. ° °

## 2015-03-03 NOTE — Progress Notes (Signed)
Subjective:    Patient ID: Philip Montgomery, male    DOB: 05-02-1961, 54 y.o.   MRN: [id number]  Otalgia  Associated symptoms include headaches, rhinorrhea and a sore throat. Pertinent negatives include no coughing.  Sore Throat  Associated symptoms include ear pain and headaches. Pertinent negatives include no coughing or shortness of breath.  Headache  Associated symptoms include dizziness, ear pain, rhinorrhea and a sore throat. Pertinent negatives include no coughing, fever or sinus pressure.   Patient presents to the office for evaluation of right ear pain which started yesterday.  He reports that he [place] and he is having some sore throat, right ear pain, and headaches.  He reports that he has been gargling with salt water and has also been taking the valtrex which has been bothering him.  He reports that he has been under a lot of stress related to house closing.  He does report that he has some bad seasonal allergies.    He reports that since his last visit he has been taking fish oils and has been doing much more exercise and working hard with his diet.    His back pain and his groin pain have definitely resolved.    Blood pressure has been well controlled at home.  No other issues noted.    Review of Systems  Constitutional: Positive for chills. Negative for fever.  HENT: Positive for ear pain, postnasal drip, rhinorrhea and sore throat. Negative for sinus pressure and voice change.   Respiratory: Negative for cough, chest tightness and shortness of breath.   Neurological: Positive for dizziness and headaches.       Objective:   Physical Exam  Constitutional: He is oriented to person, place, and time. He appears well-developed and well-nourished. No distress.  HENT:  Head: Normocephalic.  Mouth/Throat: Uvula is midline. Oral lesions present. Posterior oropharyngeal erythema present. No oropharyngeal exudate.    Eyes: Conjunctivae are normal. No  scleral icterus.  Neck: Normal range of motion. Neck supple. No JVD present. No thyromegaly present.  Cardiovascular: Normal rate, regular rhythm, normal heart sounds and intact distal pulses.  Exam reveals no gallop and no friction rub.   No murmur heard. Pulmonary/Chest: Effort normal and breath sounds normal. No respiratory distress. He has no wheezes. He has no rales. He exhibits no tenderness.  Abdominal: Soft. Bowel sounds are normal. He exhibits no distension and no mass. There is no tenderness. There is no rebound and no guarding.  Musculoskeletal: Normal range of motion.  Lymphadenopathy:    He has no cervical adenopathy.  Neurological: He is alert and oriented to person, place, and time.  Skin: Skin is warm and dry. He is not diaphoretic.  Psychiatric: He has a normal mood and affect. His behavior is normal. Judgment and thought content normal.  Nursing note and vitals reviewed.   Filed Vitals:   03/03/15 1529  BP: 128/80  Pulse: 84  Temp: 98.2 F (36.8 C)  Resp: 18         Assessment & Plan:    1. HSV-1 (herpes simplex virus 1) infection  - valACYclovir (VALTREX) 1000 MG tablet; Take 1 tablet (1,000 mg total) by mouth 2 (two) times daily.  Dispense: 60 tablet; Refill: 0  2. Allergic rhinitis, unspecified allergic rhinitis type -cont zyrtec -will send in zpak prn if no relief in 3 days - predniSONE (DELTASONE) 20 MG tablet; 3 tabs po day one, then 2 tabs daily x 4 days  Dispense: 11 tablet; Refill: 0  3. HTN -well controlled  4. Hyperlipidemia -cont fish oils will recheck in 2 weeks  5.  Elevated Blood sugar -recheck A1c in 2 weeks -continue diet and exercise

## 2015-03-16 ENCOUNTER — Ambulatory Visit: Payer: Self-pay | Admitting: Internal Medicine

## 2015-03-17 ENCOUNTER — Other Ambulatory Visit: Payer: Self-pay

## 2015-03-20 ENCOUNTER — Other Ambulatory Visit: Payer: Self-pay

## 2015-03-20 ENCOUNTER — Ambulatory Visit: Payer: Self-pay | Admitting: Internal Medicine

## 2015-03-22 ENCOUNTER — Encounter: Payer: Self-pay | Admitting: Internal Medicine

## 2015-03-23 ENCOUNTER — Other Ambulatory Visit: Payer: Self-pay | Admitting: Internal Medicine

## 2015-03-23 ENCOUNTER — Other Ambulatory Visit: Payer: BLUE CROSS/BLUE SHIELD

## 2015-03-23 DIAGNOSIS — G4700 Insomnia, unspecified: Secondary | ICD-10-CM

## 2015-03-23 MED ORDER — TRAZODONE HCL 50 MG PO TABS: 50.0000 mg | ORAL_TABLET | Freq: Every day | ORAL | Status: DC

## 2015-03-23 MED ORDER — LORAZEPAM 1 MG PO TABS: 1.0000 mg | ORAL_TABLET | Freq: Every day | ORAL | Status: DC

## 2015-03-29 ENCOUNTER — Encounter: Payer: Self-pay | Admitting: Internal Medicine

## 2015-04-28 ENCOUNTER — Ambulatory Visit (INDEPENDENT_AMBULATORY_CARE_PROVIDER_SITE_OTHER): Payer: BLUE CROSS/BLUE SHIELD | Admitting: Internal Medicine

## 2015-04-28 ENCOUNTER — Encounter: Payer: Self-pay | Admitting: Internal Medicine

## 2015-04-28 VITALS — BP 118/80 | HR 78 | Temp 98.2°F | Resp 18 | Ht 66.0 in | Wt 169.0 lb

## 2015-04-28 DIAGNOSIS — R7309 Other abnormal glucose: Secondary | ICD-10-CM

## 2015-04-28 DIAGNOSIS — E785 Hyperlipidemia, unspecified: Secondary | ICD-10-CM | POA: Diagnosis not present

## 2015-04-28 DIAGNOSIS — M7540 Impingement syndrome of unspecified shoulder: Secondary | ICD-10-CM | POA: Diagnosis not present

## 2015-04-28 DIAGNOSIS — I10 Essential (primary) hypertension: Secondary | ICD-10-CM

## 2015-04-28 DIAGNOSIS — E559 Vitamin D deficiency, unspecified: Secondary | ICD-10-CM | POA: Diagnosis not present

## 2015-04-28 DIAGNOSIS — Z79899 Other long term (current) drug therapy: Secondary | ICD-10-CM

## 2015-04-28 DIAGNOSIS — N181 Chronic kidney disease, stage 1: Secondary | ICD-10-CM

## 2015-04-28 DIAGNOSIS — R7303 Prediabetes: Secondary | ICD-10-CM

## 2015-04-28 LAB — BASIC METABOLIC PANEL WITH GFR
BUN: 14 mg/dL (ref 7–25)
CO2: 27 mmol/L (ref 20–31)
CREATININE: 1.13 mg/dL (ref 0.70–1.33)
Calcium: 9.9 mg/dL (ref 8.6–10.3)
Chloride: 101 mmol/L (ref 98–110)
GFR, EST AFRICAN AMERICAN: 84 mL/min (ref >=60)
GFR, Est Non African American: 72 mL/min (ref >=60)
Glucose, Bld: 79 mg/dL (ref 65–99)
POTASSIUM: 4.3 mmol/L (ref 3.5–5.3)
SODIUM: 138 mmol/L (ref 135–146)

## 2015-04-28 LAB — CBC WITH DIFFERENTIAL/PLATELET
BASOS ABS: 0 10*3/uL (ref 0.0–0.1)
BASOS PCT: 0 % (ref 0–1)
EOS ABS: 0.4 10*3/uL (ref 0.0–0.7)
EOS PCT: 4 % (ref 0–5)
HCT: 47.2 % (ref 39.0–52.0)
Hemoglobin: 16.1 g/dL (ref 13.0–17.0)
LYMPHS ABS: 2.3 10*3/uL (ref 0.7–4.0)
Lymphocytes Relative: 25 % (ref 12–46)
MCH: 32.7 pg (ref 26.0–34.0)
MCHC: 34.1 g/dL (ref 30.0–36.0)
MCV: 95.7 fL (ref 78.0–100.0)
MPV: 10.3 fL (ref 8.6–12.4)
Monocytes Absolute: 1 10*3/uL (ref 0.1–1.0)
Monocytes Relative: 11 % (ref 3–12)
NEUTROS PCT: 60 % (ref 43–77)
Neutro Abs: 5.4 10*3/uL (ref 1.7–7.7)
PLATELETS: 248 10*3/uL (ref 150–400)
RBC: 4.93 MIL/uL (ref 4.22–5.81)
RDW: 14 % (ref 11.5–15.5)
WBC: 9 10*3/uL (ref 4.0–10.5)

## 2015-04-28 LAB — HEPATIC FUNCTION PANEL
ALBUMIN: 4.4 g/dL (ref 3.6–5.1)
ALK PHOS: 59 U/L (ref 40–115)
ALT: 42 U/L (ref 9–46)
AST: 24 U/L (ref 10–35)
BILIRUBIN DIRECT: 0.1 mg/dL (ref <=0.2)
BILIRUBIN TOTAL: 0.6 mg/dL (ref 0.2–1.2)
Indirect Bilirubin: 0.5 mg/dL (ref 0.2–1.2)
Total Protein: 6.5 g/dL (ref 6.1–8.1)

## 2015-04-28 LAB — LIPID PANEL
CHOL/HDL RATIO: 5 ratio (ref <=5.0)
Cholesterol: 151 mg/dL (ref 125–200)
HDL: 30 mg/dL — AB (ref >=40)
LDL Cholesterol: 65 mg/dL (ref <130)
TRIGLYCERIDES: 281 mg/dL — AB (ref <150)
VLDL: 56 mg/dL — ABNORMAL HIGH (ref <30)

## 2015-04-28 LAB — MAGNESIUM: MAGNESIUM: 2.1 mg/dL (ref 1.5–2.5)

## 2015-04-28 LAB — TSH: TSH: 0.59 u[IU]/mL (ref 0.350–4.500)

## 2015-04-28 MED ORDER — PREDNISONE 20 MG PO TABS: ORAL_TABLET | ORAL | Status: DC

## 2015-04-28 NOTE — Progress Notes (Signed)
Patient ID: Philip Montgomery, male   DOB: June 09, 1961, 54 y.o.   MRN: [id number]  Assessment and Plan:  Hypertension:  -Continue medication,  -monitor blood pressure at home.  -Continue DASH diet.   -Reminder to go to the ER if any CP, SOB, nausea, dizziness, severe HA, changes vision/speech, left arm numbness and tingling, and jaw pain.  Cholesterol: -Continue diet and exercise.  -Check cholesterol.   Pre-diabetes: -Continue diet and exercise.  -Check A1C  Vitamin D Def: -check level -continue medications.   Bilateral shoulder pain -likely impingment syndrome -oral steroid x 2 weeks -home exercises given -if no improvement than will send to ortho    Continue diet and meds as discussed. Further disposition pending results of labs.  HPI 54 y.o. male  presents for 3 month follow up with hypertension, hyperlipidemia, prediabetes and vitamin D.   His blood pressure has been controlled at home, today their BP is BP: 118/80 mmHg.   He does workout. He denies chest pain, shortness of breath, dizziness.   He is on cholesterol medication and denies myalgias. His cholesterol is at goal. The cholesterol last visit was:   Lab Results  Component Value Date   CHOL 170 12/10/2014   HDL 39* 12/10/2014   LDLCALC 89 12/10/2014   TRIG 212* 12/10/2014   CHOLHDL 4.4 12/10/2014     He has been working on diet and exercise for prediabetes, and denies foot ulcerations, hyperglycemia, hypoglycemia , increased appetite, nausea, paresthesia of the feet, polydipsia, polyuria, visual disturbances, vomiting and weight loss. Last A1C in the office was:  Lab Results  Component Value Date   HGBA1C 5.5 12/10/2014    Patient is on Vitamin D supplement. No results found for: VD25OH    Patient reports that he has been having some bilateral shoulder pain while he was swimming.  He reports that he has pain when he lifts his arms over his head.  He reports that this was a 2 month irritation.  He has never had  an injury but he has required intraarticular.   Patient also reports that he has been able to get off all of his sleep medications.   He feels that he is dreaming and he has to get up to use the restroom but he feels that he can tolerate being off the medications.  He also has had some bad canker sores which has been bothering him for months.     Current Medications:  Current Outpatient Prescriptions on File Prior to Visit  Medication Sig Dispense Refill  . Cholecalciferol (VITAMIN D PO) Take 4,000 Units by mouth.    Marland Kitchen lisinopril (PRINIVIL,ZESTRIL) 10 MG tablet Take 1 tablet (10 mg total) by mouth daily. 90 tablet 3  . Multiple Vitamins-Minerals (MULTIVITAMIN PO) Take by mouth.    . Omega 3-6-9 Fatty Acids (OMEGA 3-6-9 COMPLEX PO) Take by mouth daily.    . rosuvastatin (CRESTOR) 20 MG tablet Take 1 tablet (20 mg total) by mouth daily. 90 tablet 3  . valACYclovir (VALTREX) 1000 MG tablet Take 1 tablet (1,000 mg total) by mouth 2 (two) times daily. 60 tablet 0   No current facility-administered medications on file prior to visit.    Medical History:  Past Medical History  Diagnosis Date  . Hyperlipidemia   . Hypertension   . Chronic kidney disease   . Insomnia   . HSV-1 (herpes simplex virus 1) infection   . Chronic lower back pain     Allergies: No Known Allergies  Review of Systems:  Review of Systems  Constitutional: Negative for fever, chills, weight loss and malaise/fatigue.  HENT: Negative for congestion, ear pain and sore throat.   Eyes: Negative for blurred vision and double vision.  Respiratory: Negative for cough, shortness of breath and wheezing.   Cardiovascular: Negative for chest pain, palpitations and leg swelling.  Gastrointestinal: Negative for diarrhea, constipation, blood in stool and melena.  Genitourinary: Negative.   Musculoskeletal: Positive for myalgias and joint pain.  Skin: Negative.   Neurological: Negative for dizziness, sensory change, loss of  consciousness and headaches.  Psychiatric/Behavioral: Negative for depression. The patient has insomnia. The patient is not nervous/anxious.     Family history- Review and unchanged  Social history- Review and unchanged  Physical Exam: BP 118/80 mmHg  Pulse 78  Temp(Src) 98.2 F (36.8 C) (Temporal)  Resp 18  Ht  (1.676 m)  Wt 169 lb (76.658 kg)  BMI 27.29 kg/m2 Wt Readings from Last 3 Encounters:  04/28/15 169 lb (76.658 kg)  03/03/15 167 lb (75.751 kg)  12/10/14 168 lb (76.204 kg)    General Appearance: Well nourished well developed, in no apparent distress. Eyes: PERRLA, EOMs, conjunctiva no swelling or erythema ENT/Mouth: Ear canals normal without obstruction, swelling, erythma, discharge.  TMs normal bilaterally.  Oropharynx moist, clear, without exudate, or postoropharyngeal swelling. Neck: Supple, thyroid normal,no cervical adenopathy  Respiratory: Respiratory effort normal, Breath sounds clear A&P without rhonchi, wheeze, or rale.  No retractions, no accessory usage. Cardio: RRR with no MRGs. Brisk peripheral pulses without edema.  Abdomen: Soft, + BS,  Non tender, no guarding, rebound, hernias, masses. Musculoskeletal: Full ROM, 5/5 strength, Normal gait.  Negative empty can.  Bilateral positive hawkins kennedy.   Positive speeds test.   Skin: Warm, dry without rashes, lesions, ecchymosis.  Neuro: Awake and oriented X 3, Cranial nerves intact. Normal muscle tone, no cerebellar symptoms. Psych: Normal affect, Insight and Judgment appropriate.    Terri Piedra, PA-C 8:55 AM [facility]

## 2015-04-28 NOTE — Patient Instructions (Signed)

## 2015-04-29 LAB — HEMOGLOBIN A1C
Hgb A1c MFr Bld: 5.4 % (ref <5.7)
Mean Plasma Glucose: 108 mg/dL (ref <117)

## 2015-04-29 LAB — VITAMIN D 25 HYDROXY (VIT D DEFICIENCY, FRACTURES): VIT D 25 HYDROXY: 33 ng/mL (ref 30–100)

## 2015-04-29 LAB — INSULIN, RANDOM: Insulin: 9.5 u[IU]/mL (ref 2.0–19.6)

## 2015-07-30 ENCOUNTER — Encounter: Payer: Self-pay | Admitting: Internal Medicine

## 2015-07-30 ENCOUNTER — Ambulatory Visit (INDEPENDENT_AMBULATORY_CARE_PROVIDER_SITE_OTHER): Payer: BLUE CROSS/BLUE SHIELD | Admitting: Internal Medicine

## 2015-07-30 VITALS — BP 142/86 | HR 76 | Temp 98.2°F | Resp 16 | Ht 65.5 in | Wt 172.0 lb

## 2015-07-30 DIAGNOSIS — I10 Essential (primary) hypertension: Secondary | ICD-10-CM

## 2015-07-30 DIAGNOSIS — Z125 Encounter for screening for malignant neoplasm of prostate: Secondary | ICD-10-CM

## 2015-07-30 DIAGNOSIS — Z130 Encounter for screening for diseases of the blood and blood-forming organs and certain disorders involving the immune mechanism: Secondary | ICD-10-CM

## 2015-07-30 DIAGNOSIS — Z79899 Other long term (current) drug therapy: Secondary | ICD-10-CM

## 2015-07-30 DIAGNOSIS — E785 Hyperlipidemia, unspecified: Secondary | ICD-10-CM

## 2015-07-30 DIAGNOSIS — M25511 Pain in right shoulder: Secondary | ICD-10-CM

## 2015-07-30 DIAGNOSIS — Z0000 Encounter for general adult medical examination without abnormal findings: Secondary | ICD-10-CM

## 2015-07-30 DIAGNOSIS — Z1329 Encounter for screening for other suspected endocrine disorder: Secondary | ICD-10-CM

## 2015-07-30 DIAGNOSIS — M25512 Pain in left shoulder: Secondary | ICD-10-CM

## 2015-07-30 DIAGNOSIS — Z0001 Encounter for general adult medical examination with abnormal findings: Secondary | ICD-10-CM

## 2015-07-30 DIAGNOSIS — Z131 Encounter for screening for diabetes mellitus: Secondary | ICD-10-CM

## 2015-07-30 DIAGNOSIS — E559 Vitamin D deficiency, unspecified: Secondary | ICD-10-CM

## 2015-07-30 DIAGNOSIS — N181 Chronic kidney disease, stage 1: Secondary | ICD-10-CM

## 2015-07-30 LAB — IRON AND TIBC
%SAT: 33 % (ref 15–60)
IRON: 85 ug/dL (ref 50–180)
TIBC: 261 ug/dL (ref 250–425)
UIBC: 176 ug/dL (ref 125–400)

## 2015-07-30 LAB — LIPID PANEL
CHOL/HDL RATIO: 4.1 ratio (ref <=5.0)
CHOLESTEROL: 159 mg/dL (ref 125–200)
HDL: 39 mg/dL — AB (ref >=40)
LDL Cholesterol: 68 mg/dL (ref <130)
TRIGLYCERIDES: 262 mg/dL — AB (ref <150)
VLDL: 52 mg/dL — AB (ref <30)

## 2015-07-30 NOTE — Progress Notes (Signed)
Patient ID: Philip Montgomery, male   DOB: 02/21/1961, 54 y.o.   MRN: [id number]  Complete Physical  Assessment and Plan:   1. Encounter for general adult medical examination with abnormal findings  2. Essential hypertension  - Urinalysis, Routine w reflex microscopic (not at [facility]) - Microalbumin / creatinine urine ratio - EKG 12-Lead - [place], RETROPERITNL ABD,  LTD  3. Hyperlipidemia -already checked  4. Chronic kidney disease, stage 1 -cont hydration  5. Screening for diabetes mellitus -normal from outside screening  6. Screening for thyroid disorder -normal  7. Vitamin D deficiency -cont supplement  8. Screening for prostate cancer -normal per outside labs  9. Medication management   10. Screening for deficiency anemia  - Iron and TIBC - Vitamin B12  11. Bilateral shoulder pain - Ambulatory referral to Orthopedics    Discussed med's effects and SE's. Screening labs and tests as requested with regular follow-up as recommended.  HPI Patient presents for a complete physical.   His blood pressure has been controlled at home, today their BP is BP: (!) 142/86 mmHg He does workout. He denies chest pain, shortness of breath, dizziness.  Blood pressure at home is usually 120/78 at home.  He reports that he doesn't think we need to change the dose.     He is not on cholesterol medication and denies myalgias. His cholesterol is not at goal. The cholesterol last visit was:   Lab Results  Component Value Date   CHOL 151 04/28/2015   HDL 30* 04/28/2015   LDLCALC 65 04/28/2015   TRIG 281* 04/28/2015   CHOLHDL 5.0 04/28/2015    He has been working on diet and exercise for prediabetes, he is on bASA, he is on ACE/ARB and denies foot ulcerations, hyperglycemia, hypoglycemia , increased appetite, nausea, paresthesia of the feet, polydipsia, polyuria, visual disturbances, vomiting and weight loss. Last A1C in the office was:  Lab Results  Component Value Date   HGBA1C 5.4  04/28/2015    Patient is on Vitamin D supplement.   Lab Results  Component Value Date   VD25OH 33 04/28/2015      Last PSA was: No results found for: PSA.  Denies BPH symptoms daytime frequency, double voiding, dysuria, hematuria, hesitancy, incontinence, intermittency, nocturia, sensation of incomplete bladder emptying, suprapubic pain, urgency or weak urinary stream.  Patient reports that he has been having issues with his shoulders still.  He reports that he felt better after he had some prednisone.  He thinks that he has been to [facility] in the past but this was very remote.     Current Medications:  Current Outpatient Prescriptions on File Prior to Visit  Medication Sig Dispense Refill  . Cholecalciferol (VITAMIN D PO) Take 4,000 Units by mouth.    Marland Kitchen lisinopril (PRINIVIL,ZESTRIL) 10 MG tablet Take 1 tablet (10 mg total) by mouth daily. 90 tablet 3  . Multiple Vitamins-Minerals (MULTIVITAMIN PO) Take by mouth.    . Omega 3-6-9 Fatty Acids (OMEGA 3-6-9 COMPLEX PO) Take by mouth daily.    . rosuvastatin (CRESTOR) 20 MG tablet Take 1 tablet (20 mg total) by mouth daily. 90 tablet 3  . valACYclovir (VALTREX) 1000 MG tablet Take 1 tablet (1,000 mg total) by mouth 2 (two) times daily. 60 tablet 0   No current facility-administered medications on file prior to visit.    Health Maintenance:   There is no immunization history on file for this patient.  Tetanus:  Flu vaccine: October 2016 at work Colonoscopy:  2009, Needs one due to 1st degree relative with CA Eye Exam: Dr. Hazle Quantigby, 2016 Dentist: Dr. Randa EvensSimone, 2016, [facility]  Patient Care Team: Lucky CowboyWilliam McKeown, MD as PCP - General (Internal Medicine) Sharrell KuJeffrey Medoff, MD as Consulting Physician (Gastroenterology)  Allergies: No Known Allergies  Medical History:  Past Medical History  Diagnosis Date  . Hyperlipidemia   . Hypertension   . Chronic kidney disease   . Insomnia   . HSV-1 (herpes simplex virus 1)  infection   . Chronic lower back pain     Surgical History: No past surgical history on file.  Family History: No family history on file.  Social History:   Social History  Substance Use Topics  . Smoking status: Never Smoker   . Smokeless tobacco: None  . Alcohol Use: None    Review of Systems:  Review of Systems  Constitutional: Negative for fever, chills and malaise/fatigue.  HENT: Negative for congestion, ear pain and sore throat.   Respiratory: Negative for cough, shortness of breath and wheezing.   Cardiovascular: Negative for chest pain, palpitations and leg swelling.  Gastrointestinal: Negative for heartburn, diarrhea, constipation, blood in stool and melena.  Genitourinary: Negative.   Musculoskeletal: Positive for joint pain.  Skin: Negative.   Neurological: Negative for dizziness, sensory change, loss of consciousness and headaches.  Psychiatric/Behavioral: Negative for depression. The patient has insomnia (occasional). The patient is not nervous/anxious.     Physical Exam: Estimated body mass index is 28.18 kg/(m^2) as calculated from the following:   Height as of this encounter: 5' 5.5" (1.664 m).   Weight as of this encounter: 172 lb (78.019 kg). BP 142/86 mmHg  Pulse 76  Temp(Src) 98.2 F (36.8 C) (Temporal)  Resp 16  Ht 5' 5.5" (1.664 m)  Wt 172 lb (78.019 kg)  BMI 28.18 kg/m2  General Appearance: Well nourished, in no apparent distress.  Eyes: PERRLA, EOMs, conjunctiva no swelling or erythema ENT/Mouth: Ear canals clear bilaterally with no erythema, swelling, discharge.  TMs normal bilaterally with no erythema, bulging, or retractions.  Oropharynx clear and moist with no exudate, swelling, or erythema.  Dentition normal.   Neck: Supple, thyroid normal. No bruits, JVD, cervical adenopathy Respiratory: Respiratory effort normal, BS equal bilaterally without rales, rhonchi, wheezing or stridor.  Cardio: RRR without murmurs, rubs or gallops. Brisk  peripheral pulses without edema.  Chest: symmetric, with normal excursions Abdomen: Soft, nontender, no guarding, rebound, hernias, masses, or organomegaly. Genitourinary:  Musculoskeletal: Full ROM all peripheral extremities,5/5 strength, and normal gait.  Skin: Warm, dry without rashes, lesions, ecchymosis. Neuro: A&Ox3, Cranial nerves intact, reflexes equal bilaterally. Normal muscle tone, no cerebellar symptoms. Sensation intact.  Psych: Normal affect, Insight and Judgment appropriate.   EKG: WNL no changes.  AORTA SCAN: WNL  Over 40 minutes of exam, counseling, chart review and critical decision making was performed  Toni Amendourtney Forcucci 8:59 AM [facility]

## 2015-07-30 NOTE — Patient Instructions (Signed)
Preventive Care for Adults  A healthy lifestyle and preventive care can promote health and wellness. Preventive health guidelines for men include the following key practices:  A routine yearly physical is a good way to check with your health care provider about your health and preventative screening. It is a chance to share any concerns and updates on your health and to receive a thorough exam.  Visit your dentist for a routine exam and preventative care every 6 months. Brush your teeth twice a day and floss once a day. Good oral hygiene prevents tooth decay and gum disease.  The frequency of eye exams is based on your age, health, family medical history, use of contact lenses, and other factors. Follow your health care provider's recommendations for frequency of eye exams.  Eat a healthy diet. Foods such as vegetables, fruits, whole grains, low-fat dairy products, and lean protein foods contain the nutrients you need without too many calories. Decrease your intake of foods high in solid fats, added sugars, and salt. Eat the right amount of calories for you.Get information about a proper diet from your health care provider, if necessary.  Regular physical exercise is one of the most important things you can do for your health. Most adults should get at least 150 minutes of moderate-intensity exercise (any activity that increases your heart rate and causes you to sweat) each week. In addition, most adults need muscle-strengthening exercises on 2 or more days a week.  Maintain a healthy weight. The body mass index (BMI) is a screening tool to identify possible weight problems. It provides an estimate of body fat based on height and weight. Your health care provider can find your BMI and can help you achieve or maintain a healthy weight.For adults 20 years and older:  A BMI below 18.5 is considered underweight.  A BMI of 18.5 to 24.9 is normal.  A BMI of 25 to 29.9 is considered overweight.  A  BMI of 30 and above is considered obese.  Maintain normal blood lipids and cholesterol levels by exercising and minimizing your intake of saturated fat. Eat a balanced diet with plenty of fruit and vegetables. Blood tests for lipids and cholesterol should begin at age 20 and be repeated every 5 years. If your lipid or cholesterol levels are high, you are over 50, or you are at high risk for heart disease, you may need your cholesterol levels checked more frequently.Ongoing high lipid and cholesterol levels should be treated with medicines if diet and exercise are not working.  If you smoke, find out from your health care provider how to quit. If you do not use tobacco, do not start.  Lung cancer screening is recommended for adults aged 55-80 years who are at high risk for developing lung cancer because of a history of smoking. A yearly low-dose CT scan of the lungs is recommended for people who have at least a 30-pack-year history of smoking and are a current smoker or have quit within the past 15 years. A pack year of smoking is smoking an average of 1 pack of cigarettes a day for 1 year (for example: 1 pack a day for 30 years or 2 packs a day for 15 years). Yearly screening should continue until the smoker has stopped smoking for at least 15 years. Yearly screening should be stopped for people who develop a health problem that would prevent them from having lung cancer treatment.  If you choose to drink alcohol, do not have more   than 2 drinks per day. One drink is considered to be 12 ounces (355 mL) of beer, 5 ounces (148 mL) of wine, or 1.5 ounces (44 mL) of liquor.  Avoid use of street drugs. Do not share needles with anyone. Ask for help if you need support or instructions about stopping the use of drugs.  High blood pressure causes heart disease and increases the risk of stroke. Your blood pressure should be checked at least every 1-2 years. Ongoing high blood pressure should be treated with  medicines, if weight loss and exercise are not effective.  If you are 45-79 years old, ask your health care provider if you should take aspirin to prevent heart disease.  Diabetes screening involves taking a blood sample to check your fasting blood sugar level. This should be done once every 3 years, after age 45, if you are within normal weight and without risk factors for diabetes. Testing should be considered at a younger age or be carried out more frequently if you are overweight and have at least 1 risk factor for diabetes.  Colorectal cancer can be detected and often prevented. Most routine colorectal cancer screening begins at the age of 50 and continues through age 75. However, your health care provider may recommend screening at an earlier age if you have risk factors for colon cancer. On a yearly basis, your health care provider may provide home test kits to check for hidden blood in the stool. Use of a small camera at the end of a tube to directly examine the colon (sigmoidoscopy or colonoscopy) can detect the earliest forms of colorectal cancer. Talk to your health care provider about this at age 50, when routine screening begins. Direct exam of the colon should be repeated every 5-10 years through age 75, unless early forms of precancerous polyps or small growths are found.   Talk with your health care provider about prostate cancer screening.  Testicular cancer screening isrecommended for adult males. Screening includes self-exam, a health care provider exam, and other screening tests. Consult with your health care provider about any symptoms you have or any concerns you have about testicular cancer.  Use sunscreen. Apply sunscreen liberally and repeatedly throughout the day. You should seek shade when your shadow is shorter than you. Protect yourself by wearing long sleeves, pants, a wide-brimmed hat, and sunglasses year round, whenever you are outdoors.  Once a month, do a whole-body  skin exam, using a mirror to look at the skin on your back. Tell your health care provider about new moles, moles that have irregular borders, moles that are larger than a pencil eraser, or moles that have changed in shape or color.  Stay current with required vaccines (immunizations).  Influenza vaccine. All adults should be immunized every year.  Tetanus, diphtheria, and acellular pertussis (Td, Tdap) vaccine. An adult who has not previously received Tdap or who does not know his vaccine status should receive 1 dose of Tdap. This initial dose should be followed by tetanus and diphtheria toxoids (Td) booster doses every 10 years. Adults with an unknown or incomplete history of completing a 3-dose immunization series with Td-containing vaccines should begin or complete a primary immunization series including a Tdap dose. Adults should receive a Td booster every 10 years.  Varicella vaccine. An adult without evidence of immunity to varicella should receive 2 doses or a second dose if he has previously received 1 dose.  Human papillomavirus (HPV) vaccine. Males aged 13-21 years who have not   received the vaccine previously should receive the 3-dose series. Males aged 22-26 years may be immunized. Immunization is recommended through the age of 26 years for any male who has sex with males and did not get any or all doses earlier. Immunization is recommended for any person with an immunocompromised condition through the age of 26 years if he did not get any or all doses earlier. During the 3-dose series, the second dose should be obtained 4-8 weeks after the first dose. The third dose should be obtained 24 weeks after the first dose and 16 weeks after the second dose.  Zoster vaccine. One dose is recommended for adults aged 60 years or older unless certain conditions are present.    PREVNAR  - Pneumococcal 13-valent conjugate (PCV13) vaccine. When indicated, a person who is uncertain of his immunization  history and has no record of immunization should receive the PCV13 vaccine. An adult aged 19 years or older who has certain medical conditions and has not been previously immunized should receive 1 dose of PCV13 vaccine. This PCV13 should be followed with a dose of pneumococcal polysaccharide (PPSV23) vaccine. The PPSV23 vaccine dose should be obtained at least 8 weeks after the dose of PCV13 vaccine. An adult aged 19 years or older who has certain medical conditions and previously received 1 or more doses of PPSV23 vaccine should receive 1 dose of PCV13. The PCV13 vaccine dose should be obtained 1 or more years after the last PPSV23 vaccine dose.    PNEUMOVAX - Pneumococcal polysaccharide (PPSV23) vaccine. When PCV13 is also indicated, PCV13 should be obtained first. All adults aged 65 years and older should be immunized. An adult younger than age 65 years who has certain medical conditions should be immunized. Any person who resides in a nursing home or long-term care facility should be immunized. An adult smoker should be immunized. People with an immunocompromised condition and certain other conditions should receive both PCV13 and PPSV23 vaccines. People with human immunodeficiency virus (HIV) infection should be immunized as soon as possible after diagnosis. Immunization during chemotherapy or radiation therapy should be avoided. Routine use of PPSV23 vaccine is not recommended for American Indians, [place] Natives, or people younger than 65 years unless there are medical conditions that require PPSV23 vaccine. When indicated, people who have unknown immunization and have no record of immunization should receive PPSV23 vaccine. One-time revaccination 5 years after the first dose of PPSV23 is recommended for people aged 19-64 years who have chronic kidney failure, nephrotic syndrome, asplenia, or immunocompromised conditions. People who received 1-2 doses of PPSV23 before age 65 years should receive another  dose of PPSV23 vaccine at age 65 years or later if at least 5 years have passed since the previous dose. Doses of PPSV23 are not needed for people immunized with PPSV23 at or after age 65 years.    Hepatitis A vaccine. Adults who wish to be protected from this disease, have certain high-risk conditions, work with hepatitis A-infected animals, work in hepatitis A research labs, or travel to or work in countries with a high rate of hepatitis A should be immunized. Adults who were previously unvaccinated and who anticipate close contact with an international adoptee during the first 60 days after arrival in the [place] from a country with a high rate of hepatitis A should be immunized.    Hepatitis B vaccine. Adults should be immunized if they wish to be protected from this disease, have certain high-risk conditions, may be exposed to   blood or other infectious body fluids, are household contacts or sex partners of hepatitis B positive people, are clients or workers in certain care facilities, or travel to or work in countries with a high rate of hepatitis B.   Preventive Service / Frequency   Ages 40 to 64  Blood pressure check.  Lipid and cholesterol check  Lung cancer screening. / Every year if you are aged 55-80 years and have a 30-pack-year history of smoking and currently smoke or have quit within the past 15 years. Yearly screening is stopped once you have quit smoking for at least 15 years or develop a health problem that would prevent you from having lung cancer treatment.  Fecal occult blood test (FOBT) of stool. / Every year beginning at age 50 and continuing until age 75. You may not have to do this test if you get a colonoscopy every 10 years.  Flexible sigmoidoscopy** or colonoscopy.** / Every 5 years for a flexible sigmoidoscopy or every 10 years for a colonoscopy beginning at age 50 and continuing until age 75. Screening for abdominal aortic aneurysm (AAA)  by ultrasound is  recommended for people who have history of high blood pressure or who are current or former smokers.   

## 2015-07-31 LAB — URINALYSIS, ROUTINE W REFLEX MICROSCOPIC
Bilirubin Urine: NEGATIVE
GLUCOSE, UA: NEGATIVE
HGB URINE DIPSTICK: NEGATIVE
Ketones, ur: NEGATIVE
LEUKOCYTES UA: NEGATIVE
Nitrite: NEGATIVE
PH: 7 (ref 5.0–8.0)
Protein, ur: NEGATIVE
Specific Gravity, Urine: 1.025 (ref 1.001–1.035)

## 2015-07-31 LAB — VITAMIN B12: Vitamin B-12: 851 pg/mL (ref 211–911)

## 2015-07-31 LAB — MICROALBUMIN / CREATININE URINE RATIO
Creatinine, Urine: 196 mg/dL (ref 20–370)
MICROALB/CREAT RATIO: 2 ug/mg{creat} (ref <30)
Microalb, Ur: 0.3 mg/dL

## 2015-09-17 ENCOUNTER — Ambulatory Visit (INDEPENDENT_AMBULATORY_CARE_PROVIDER_SITE_OTHER): Payer: BLUE CROSS/BLUE SHIELD | Admitting: Internal Medicine

## 2015-09-17 ENCOUNTER — Encounter: Payer: Self-pay | Admitting: Internal Medicine

## 2015-09-17 VITALS — BP 126/84 | HR 90 | Temp 97.8°F | Resp 16 | Ht 65.0 in | Wt 168.0 lb

## 2015-09-17 DIAGNOSIS — K121 Other forms of stomatitis: Secondary | ICD-10-CM | POA: Diagnosis not present

## 2015-09-17 NOTE — Progress Notes (Signed)
   Subjective:    Patient ID: Philip Montgomery, male    DOB: 04-11-1961, 55 y.o.   MRN: [id number]  HPI  Patient presents to the office for evaluation of blisters on the inside of the mouth.  He reports that he tends to get fluid filled blister especially along the gum line below the molars.  They tend to get bigger quickly and they are painful when he pops them.  He reports that it tends to push against the side of the tongue.  He does report that it is different than canker sores.  He reports that they happen so rapidly and then they go away.  After popping the blister goes away it tends to last very minimal amount of time.  He reports that his dentist doesn't believe that this is HSV.  He reports that neither the decadron or the valcyclovir tends to make them go away.  Brushes twice daily and flosses nightly.  Patient recently had a crown placed in the same location as the blisters at the beginning of January.      Review of Systems  Constitutional: Negative for fever, chills and fatigue.  HENT: Positive for mouth sores. Negative for congestion, ear pain, hearing loss, nosebleeds, rhinorrhea, sinus pressure, sore throat and trouble swallowing.   Respiratory: Negative for cough.        Objective:   Physical Exam  Constitutional: He is oriented to person, place, and time. He appears well-developed and well-nourished. No distress.  HENT:  Head: Normocephalic.  Mouth/Throat: Oropharynx is clear and moist and mucous membranes are normal. Oral lesions present. No trismus in the jaw. No dental abscesses, lacerations or dental caries. No oropharyngeal exudate.    Eyes: Conjunctivae are normal. No scleral icterus.  Neck: Normal range of motion. Neck supple. No JVD present. No thyromegaly present.  Cardiovascular: Normal rate, regular rhythm, normal heart sounds and intact distal pulses.  Exam reveals no gallop and no friction rub.   No murmur heard. Pulmonary/Chest: Effort normal and breath sounds  normal. No respiratory distress. He has no wheezes. He has no rales. He exhibits no tenderness.  Abdominal: Soft. Bowel sounds are normal.  Musculoskeletal: Normal range of motion.  Lymphadenopathy:    He has no cervical adenopathy.  Neurological: He is alert and oriented to person, place, and time.  Skin: Skin is warm and dry. He is not diaphoretic.  Psychiatric: He has a normal mood and affect. His behavior is normal. Judgment and thought content normal.  Nursing note and vitals reviewed.   Filed Vitals:   09/17/15 1541  BP: 126/84  Pulse: 90  Temp: 97.8 F (36.6 C)  Resp: 16         Assessment & Plan:    1. Stomatitis -likely blister from recent crown placement in December -cont acyclovir for canker sores -reassurance given for blisters  -likely friction based.

## 2015-09-19 ENCOUNTER — Encounter: Payer: Self-pay | Admitting: *Deleted

## 2015-12-01 ENCOUNTER — Encounter: Payer: Self-pay | Admitting: Internal Medicine

## 2015-12-07 ENCOUNTER — Encounter: Payer: Self-pay | Admitting: Internal Medicine

## 2015-12-07 ENCOUNTER — Ambulatory Visit (INDEPENDENT_AMBULATORY_CARE_PROVIDER_SITE_OTHER): Payer: BLUE CROSS/BLUE SHIELD | Admitting: Internal Medicine

## 2015-12-07 VITALS — BP 142/86 | HR 80 | Temp 98.0°F | Resp 16 | Ht 65.5 in | Wt 176.0 lb

## 2015-12-07 DIAGNOSIS — R5383 Other fatigue: Secondary | ICD-10-CM

## 2015-12-07 DIAGNOSIS — M2550 Pain in unspecified joint: Secondary | ICD-10-CM | POA: Diagnosis not present

## 2015-12-07 LAB — TSH: TSH: 0.44 m[IU]/L (ref 0.40–4.50)

## 2015-12-07 LAB — SEDIMENTATION RATE: Sed Rate: 1 mm/hr (ref 0–20)

## 2015-12-07 LAB — C-REACTIVE PROTEIN: CRP: <0.5 mg/dL (ref <0.60)

## 2015-12-07 LAB — RHEUMATOID FACTOR

## 2015-12-07 MED ORDER — MELOXICAM 15 MG PO TABS: 15.0000 mg | ORAL_TABLET | Freq: Every day | ORAL | Status: DC

## 2015-12-07 NOTE — Progress Notes (Signed)
Subjective:    Patient ID: Philip StampsRene Montgomery, male    DOB: [birth year], [age] y.o.   MRN: [id number]  Extremity Weakness  Pertinent negatives include no fever or numbness.  Dizziness Associated symptoms include arthralgias, fatigue and myalgias. Pertinent negatives include no abdominal pain, chest pain, chills, coughing, fever, headaches, joint swelling, nausea, neck pain, numbness, vomiting or weakness.   Patient presents to the office for evaluation of overall joint pain which has been going on for the past several months.  He reports that this started at the same time that he started having a decline in his sex drive and also reports that he is extremetly fatigued lately.   He reports that he can barely get up off the floor.  It has been a lot worse for the past month.  He reports that he cannot carry 5 gallon pales and that it seems like he is going to hurt his joints.  He reports that he is doing stretches and he is doing some exercises.  He reports that he is sleeping on a futon.  He reports that he hasn't felt joint pain like this before.  He reports that the joints feel stable but weak.  He reports that he has been taking meloxicam but that doesn't really make a big difference.  He feels like he has to stagger to get up.  He cannot stand on one foot.  He has not had a change in medications.  He reports that he has never seen a neurology.  He reports that he has no family history of autoimmune disease.  He does admit to being under increased stress due to a move that is going on.  They are moving to another house to get his son into a better school district.     Review of Systems  Constitutional: Positive for fatigue. Negative for fever, chills and unexpected weight change.  Eyes: Negative.   Respiratory: Negative for cough, chest tightness and shortness of breath.   Cardiovascular: Negative for chest pain and palpitations.  Gastrointestinal: Negative for nausea, vomiting, abdominal pain, diarrhea and  constipation.  Genitourinary: Negative for dysuria, frequency, hematuria and difficulty urinating.  Musculoskeletal: Positive for myalgias, arthralgias, gait problem and extremity weakness. Negative for back pain, joint swelling, neck pain and neck stiffness.  Neurological: Positive for dizziness. Negative for syncope, weakness, light-headedness, numbness and headaches.  Psychiatric/Behavioral: Positive for dysphoric mood.       Objective:   Physical Exam  Constitutional: He is oriented to person, place, and time. He appears well-developed and well-nourished. No distress.  HENT:  Head: Normocephalic.  Mouth/Throat: Oropharynx is clear and moist. No oropharyngeal exudate.  Eyes: Conjunctivae and EOM are normal. Pupils are equal, round, and reactive to light. No scleral icterus.  Neck: Normal range of motion. Neck supple. No JVD present. No thyromegaly present.  Cardiovascular: Normal rate, regular rhythm, normal heart sounds and intact distal pulses.  Exam reveals no gallop and no friction rub.   No murmur heard. Pulmonary/Chest: Effort normal and breath sounds normal. No respiratory distress. He has no wheezes. He has no rales. He exhibits no tenderness.  Abdominal: Soft. Bowel sounds are normal. He exhibits no distension and no mass. There is no tenderness. There is no rebound and no guarding.  Musculoskeletal: Normal range of motion. He exhibits no tenderness.  Lymphadenopathy:    He has no cervical adenopathy.  Neurological: He is alert and oriented to person, place, and time. He displays normal reflexes. No cranial nerve  deficit or sensory deficit. He exhibits normal muscle tone. Coordination and gait normal. GCS eye subscore is 4. GCS verbal subscore is 5. GCS motor subscore is 6.  Reflex Scores:      Tricep reflexes are 2+ on the right side and 2+ on the left side.      Bicep reflexes are 2+ on the right side and 2+ on the left side.      Brachioradialis reflexes are 2+ on the right  side and 2+ on the left side.      Patellar reflexes are 2+ on the right side and 2+ on the left side.      Achilles reflexes are 2+ on the right side and 2+ on the left side. Skin: Skin is warm and dry. He is not diaphoretic.  Psychiatric: He has a normal mood and affect. His behavior is normal. Judgment and thought content normal.  Nursing note and vitals reviewed.   Filed Vitals:   12/07/15 0839  BP: 142/86  Pulse: 80  Temp: 98 F (36.7 C)  Resp: 16          Assessment & Plan:   Exam unremarkable.  Neurological exam normal.  Suspect that a large portion of this is likely deconditioning and likely osteoarthritis.  Recommended continuing mobic.  Will screen for possible autoimmune but story is not consistent with this.  Will also check testosterone and thyroid.  If labs normal will recommend increased physical activity.    1. Arthralgia  - Sedimentation rate - Angiotensin converting enzyme - Anti-DNA antibody, double-stranded - ANA - C3 and C4 - Cyclic citrul peptide antibody, IgG - C-reactive protein - Rheumatoid factor - Complement, total - ANCA screen with reflex titer - Sjogrens syndrome-A extractable nuclear antibody - Sjogrens syndrome-B extractable nuclear antibody  2. Other fatigue  - TSH - Testosterone

## 2015-12-08 LAB — SJOGRENS SYNDROME-B EXTRACTABLE NUCLEAR ANTIBODY: SSB (LA) (ENA) ANTIBODY, IGG: NEGATIVE

## 2015-12-08 LAB — ANTI-DNA ANTIBODY, DOUBLE-STRANDED: DS DNA AB: 2 [IU]/mL

## 2015-12-08 LAB — SJOGRENS SYNDROME-A EXTRACTABLE NUCLEAR ANTIBODY: SSA (RO) (ENA) ANTIBODY, IGG: NEGATIVE

## 2015-12-08 LAB — C3 AND C4
C3 Complement: 142 mg/dL (ref 90–180)
C4 COMPLEMENT: 31 mg/dL (ref 10–40)

## 2015-12-08 LAB — TESTOSTERONE: TESTOSTERONE: 228 ng/dL — AB (ref 250–827)

## 2015-12-08 LAB — CYCLIC CITRUL PEPTIDE ANTIBODY, IGG

## 2015-12-08 LAB — ANGIOTENSIN CONVERTING ENZYME: ANGIOTENSIN-CONVERTING ENZYME: 13 U/L (ref 8–52)

## 2015-12-09 LAB — ANCA SCREEN W REFLEX TITER: ANCA Screen: POSITIVE — AB

## 2015-12-09 LAB — RFLX P-ANCA TITER: P-ANCA: 1:40 {titer} — ABNORMAL HIGH

## 2015-12-09 LAB — ANA: Anti Nuclear Antibody(ANA): NEGATIVE

## 2015-12-10 ENCOUNTER — Other Ambulatory Visit: Payer: Self-pay | Admitting: Internal Medicine

## 2015-12-10 DIAGNOSIS — IMO0001 Reserved for inherently not codable concepts without codable children: Secondary | ICD-10-CM

## 2015-12-10 DIAGNOSIS — R768 Other specified abnormal immunological findings in serum: Secondary | ICD-10-CM

## 2015-12-10 DIAGNOSIS — E291 Testicular hypofunction: Secondary | ICD-10-CM

## 2015-12-10 LAB — COMPLEMENT, TOTAL

## 2015-12-10 MED ORDER — TESTOSTERONE CYPIONATE 200 MG/ML IM SOLN: 100.0000 mg | INTRAMUSCULAR | Status: DC

## 2015-12-16 ENCOUNTER — Encounter: Payer: Self-pay | Admitting: Internal Medicine

## 2015-12-23 ENCOUNTER — Ambulatory Visit (INDEPENDENT_AMBULATORY_CARE_PROVIDER_SITE_OTHER): Payer: BLUE CROSS/BLUE SHIELD | Admitting: *Deleted

## 2015-12-23 ENCOUNTER — Encounter: Payer: Self-pay | Admitting: Internal Medicine

## 2015-12-23 DIAGNOSIS — E291 Testicular hypofunction: Secondary | ICD-10-CM | POA: Diagnosis not present

## 2015-12-23 DIAGNOSIS — R768 Other specified abnormal immunological findings in serum: Secondary | ICD-10-CM | POA: Diagnosis not present

## 2015-12-23 MED ORDER — TESTOSTERONE CYPIONATE 200 MG/ML IM SOLN
100.0000 mg | Freq: Once | INTRAMUSCULAR | Status: AC
  Administered 2015-12-23: 100 mg via INTRAMUSCULAR

## 2015-12-23 NOTE — Progress Notes (Signed)
Patient ID: Philip StampsRene Montgomery, male   DOB: 1960-08-13, 55 y.o.   MRN: [id number] Patient presents for testosterone injection for hypogonadism Tx. Patient originally wanted to learn how to administer his own injections to himself, but changed his mind and does not feel that he will be able to do so.  Patient wants to call his insurance and see what is the preferred topical testosterone covered through his insurance and will send a MyChart message to let us know what to call in for him.  Patient did already purchase his testosterone cypionate Rx so I administered 0.5 cc IM left glute per Rx sig and he tolerated well.

## 2015-12-24 MED ORDER — "SYRINGE 21G X 1"" 3 ML MISC": Status: DC

## 2016-01-04 ENCOUNTER — Other Ambulatory Visit: Payer: Self-pay | Admitting: Internal Medicine

## 2016-01-28 ENCOUNTER — Encounter: Payer: Self-pay | Admitting: Internal Medicine

## 2016-01-28 ENCOUNTER — Ambulatory Visit (INDEPENDENT_AMBULATORY_CARE_PROVIDER_SITE_OTHER): Payer: BLUE CROSS/BLUE SHIELD | Admitting: Internal Medicine

## 2016-01-28 VITALS — BP 126/70 | HR 82 | Temp 98.0°F | Resp 16 | Ht 65.5 in | Wt 175.0 lb

## 2016-01-28 DIAGNOSIS — E291 Testicular hypofunction: Secondary | ICD-10-CM | POA: Diagnosis not present

## 2016-01-28 DIAGNOSIS — E785 Hyperlipidemia, unspecified: Secondary | ICD-10-CM

## 2016-01-28 DIAGNOSIS — E559 Vitamin D deficiency, unspecified: Secondary | ICD-10-CM

## 2016-01-28 DIAGNOSIS — I10 Essential (primary) hypertension: Secondary | ICD-10-CM | POA: Diagnosis not present

## 2016-01-28 DIAGNOSIS — N181 Chronic kidney disease, stage 1: Secondary | ICD-10-CM

## 2016-01-28 DIAGNOSIS — R7303 Prediabetes: Secondary | ICD-10-CM | POA: Diagnosis not present

## 2016-01-28 DIAGNOSIS — E349 Endocrine disorder, unspecified: Secondary | ICD-10-CM | POA: Insufficient documentation

## 2016-01-28 DIAGNOSIS — Z79899 Other long term (current) drug therapy: Secondary | ICD-10-CM

## 2016-01-28 LAB — LIPID PANEL
CHOL/HDL RATIO: 4.8 ratio (ref <=5.0)
CHOLESTEROL: 162 mg/dL (ref 125–200)
HDL: 34 mg/dL — AB (ref >=40)
LDL CALC: 78 mg/dL (ref <130)
TRIGLYCERIDES: 249 mg/dL — AB (ref <150)
VLDL: 50 mg/dL — AB (ref <30)

## 2016-01-28 LAB — CBC WITH DIFFERENTIAL/PLATELET
Basophils Absolute: 0 cells/uL (ref 0–200)
Basophils Relative: 0 %
EOS PCT: 2 %
Eosinophils Absolute: 156 cells/uL (ref 15–500)
HCT: 48 % (ref 38.5–50.0)
HEMOGLOBIN: 16.5 g/dL (ref 13.2–17.1)
LYMPHS ABS: 2184 {cells}/uL (ref 850–3900)
Lymphocytes Relative: 28 %
MCH: 31.6 pg (ref 27.0–33.0)
MCHC: 34.4 g/dL (ref 32.0–36.0)
MCV: 92 fL (ref 80.0–100.0)
MPV: 10.5 fL (ref 7.5–12.5)
Monocytes Absolute: 780 cells/uL (ref 200–950)
Monocytes Relative: 10 %
NEUTROS ABS: 4680 {cells}/uL (ref 1500–7800)
Neutrophils Relative %: 60 %
Platelets: 246 10*3/uL (ref 140–400)
RBC: 5.22 MIL/uL (ref 4.20–5.80)
RDW: 13.1 % (ref 11.0–15.0)
WBC: 7.8 10*3/uL (ref 3.8–10.8)

## 2016-01-28 LAB — HEPATIC FUNCTION PANEL
ALT: 35 U/L (ref 9–46)
AST: 24 U/L (ref 10–35)
Albumin: 4.4 g/dL (ref 3.6–5.1)
Alkaline Phosphatase: 63 U/L (ref 40–115)
BILIRUBIN DIRECT: 0.1 mg/dL (ref <=0.2)
BILIRUBIN INDIRECT: 0.5 mg/dL (ref 0.2–1.2)
BILIRUBIN TOTAL: 0.6 mg/dL (ref 0.2–1.2)
Total Protein: 6.6 g/dL (ref 6.1–8.1)

## 2016-01-28 LAB — BASIC METABOLIC PANEL WITH GFR
BUN: 17 mg/dL (ref 7–25)
CALCIUM: 10.1 mg/dL (ref 8.6–10.3)
CO2: 28 mmol/L (ref 20–31)
CREATININE: 1.06 mg/dL (ref 0.70–1.33)
Chloride: 101 mmol/L (ref 98–110)
GFR, EST NON AFRICAN AMERICAN: 78 mL/min (ref >=60)
GFR, Est African American: >89 mL/min (ref >=60)
Glucose, Bld: 87 mg/dL (ref 65–99)
Potassium: 4.3 mmol/L (ref 3.5–5.3)
SODIUM: 138 mmol/L (ref 135–146)

## 2016-01-28 LAB — TESTOSTERONE: Testosterone: 495 ng/dL (ref 250–827)

## 2016-01-28 LAB — TSH: TSH: 0.92 m[IU]/L (ref 0.40–4.50)

## 2016-01-28 MED ORDER — VALACYCLOVIR HCL 1 G PO TABS: 1000.0000 mg | ORAL_TABLET | Freq: Two times a day (BID) | ORAL | Status: DC

## 2016-01-28 NOTE — Progress Notes (Signed)
Assessment and Plan:  Hypertension:  -Continue medication,  -monitor blood pressure at home.  -Continue DASH diet.   -Reminder to go to the ER if any CP, SOB, nausea, dizziness, severe HA, changes vision/speech, left arm numbness and tingling, and jaw pain.  Cholesterol: -Continue diet and exercise.  -Check cholesterol.   Pre-diabetes: -Continue diet and exercise.  -Check A1C  Vitamin D Def: -check level -continue medications.   Hypogonadism -cont testosterone -dose adjust if necessary -testosterone level  Continue diet and meds as discussed. Further disposition pending results of labs.  HPI 55 y.o. male  presents for 3 month follow up with hypertension, hyperlipidemia, prediabetes and vitamin D.   His blood pressure has been controlled at home, today their BP is BP: 126/70 mmHg.   He does workout. He denies chest pain, shortness of breath, dizziness.   He is on cholesterol medication and denies myalgias. His cholesterol is at goal. The cholesterol last visit was:   Lab Results  Component Value Date   CHOL 159 07/30/2015   HDL 39* 07/30/2015   LDLCALC 68 07/30/2015   TRIG 262* 07/30/2015   CHOLHDL 4.1 07/30/2015     He has been working on diet and exercise for prediabetes, and denies foot ulcerations, hyperglycemia, hypoglycemia , increased appetite, nausea, paresthesia of the feet, polydipsia, polyuria, visual disturbances, vomiting and weight loss. Last A1C in the office was:  Lab Results  Component Value Date   HGBA1C 5.4 04/28/2015    Patient is on Vitamin D supplement.  Lab Results  Component Value Date   VD25OH 3333 04/28/2015     He reports that he feels a little bit better on the testosterone.  He did a shot a little over a week ago.  He reports that his occupational nurse at work gives the injections.    He reports he did see the rheumatologist and evaluation was negative.    Current Medications:  Current Outpatient Prescriptions on File Prior to Visit   Medication Sig Dispense Refill  . Cholecalciferol (VITAMIN D PO) Take 4,000 Units by mouth.    . dexamethasone (DECADRON) 0.5 MG/5ML solution Take 0.5 mg by mouth 4 (four) times daily as needed.    Marland Kitchen. lisinopril (PRINIVIL,ZESTRIL) 10 MG tablet Take 1 tablet (10 mg total) by mouth daily. 90 tablet 3  . meloxicam (MOBIC) 15 MG tablet Take 1 tablet (15 mg total) by mouth daily. 90 tablet 0  . Multiple Vitamins-Minerals (ICAPS AREDS 2 PO) Take 1 tablet by mouth 2 (two) times daily.    . Multiple Vitamins-Minerals (MULTIVITAMIN PO) Take by mouth.    . niacin 500 MG tablet Take 500 mg by mouth at bedtime.    . Omega 3-6-9 Fatty Acids (OMEGA 3-6-9 COMPLEX PO) Take by mouth daily.    . rosuvastatin (CRESTOR) 20 MG tablet Take 1 tablet (20 mg total) by mouth daily. 90 tablet 3  . Syringe/Needle, Disp, (SYRINGE 3CC/21GX1") 21G X 1" 3 ML MISC Use AD for testosterone cypionate injection 12 each 0  . testosterone cypionate (DEPOTESTOSTERONE CYPIONATE) 200 MG/ML injection Inject 0.5 mLs (100 mg total) into the muscle every 14 (fourteen) days. 10 mL 0  . valACYclovir (VALTREX) 1000 MG tablet take 1 tablet by mouth twice a day 60 tablet 0   No current facility-administered medications on file prior to visit.    Medical History:  Past Medical History  Diagnosis Date  . Hyperlipidemia   . Hypertension   . Chronic kidney disease   . Insomnia   .  HSV-1 (herpes simplex virus 1) infection   . Chronic lower back pain     Allergies: No Known Allergies   Review of Systems:  Review of Systems  Constitutional: Negative for fever, chills and malaise/fatigue.  HENT: Negative for congestion, ear pain and sore throat.   Respiratory: Negative for cough, shortness of breath and wheezing.   Cardiovascular: Negative for chest pain, palpitations and leg swelling.  Gastrointestinal: Negative for heartburn, abdominal pain, diarrhea, constipation, blood in stool and melena.  Genitourinary: Negative.   Skin:  Negative.   Neurological: Negative for dizziness, sensory change, loss of consciousness and headaches.  Psychiatric/Behavioral: Negative for depression. The patient is not nervous/anxious and does not have insomnia.     Family history- Review and unchanged  Social history- Review and unchanged  Physical Exam: BP 126/70 mmHg  Pulse 82  Temp(Src) 98 F (36.7 C) (Temporal)  Resp 16  Ht 5' 5.5" (1.664 m)  Wt 175 lb (79.379 kg)  BMI 28.67 kg/m2 Wt Readings from Last 3 Encounters:  01/28/16 175 lb (79.379 kg)  12/07/15 176 lb (79.833 kg)  09/17/15 168 lb (76.204 kg)    General Appearance: Well nourished well developed, in no apparent distress. Eyes: PERRLA, EOMs, conjunctiva no swelling or erythema ENT/Mouth: Ear canals normal without obstruction, swelling, erythma, discharge.  TMs normal bilaterally.  Oropharynx moist, clear, without exudate, or postoropharyngeal swelling. Neck: Supple, thyroid normal,no cervical adenopathy  Respiratory: Respiratory effort normal, Breath sounds clear A&P without rhonchi, wheeze, or rale.  No retractions, no accessory usage. Cardio: RRR with no MRGs. Brisk peripheral pulses without edema.  Abdomen: Soft, + BS,  Non tender, no guarding, rebound, hernias, masses. Musculoskeletal: Full ROM, 5/5 strength, Normal gait Skin: Warm, dry without rashes, lesions, ecchymosis.  Neuro: Awake and oriented X 3, Cranial nerves intact. Normal muscle tone, no cerebellar symptoms. Psych: Normal affect, Insight and Judgment appropriate.    Terri Piedraourtney Forcucci, PA-C 9:07 AM [facility]

## 2016-01-29 LAB — HEMOGLOBIN A1C
Hgb A1c MFr Bld: 5.2 % (ref <5.7)
Mean Plasma Glucose: 103 mg/dL

## 2016-02-21 ENCOUNTER — Encounter: Payer: Self-pay | Admitting: Internal Medicine

## 2016-03-10 DIAGNOSIS — Z23 Encounter for immunization: Secondary | ICD-10-CM | POA: Diagnosis not present

## 2016-03-23 ENCOUNTER — Other Ambulatory Visit: Payer: Self-pay | Admitting: *Deleted

## 2016-03-23 MED ORDER — LISINOPRIL 10 MG PO TABS: 10.0000 mg | ORAL_TABLET | Freq: Every day | ORAL | 3 refills | Status: DC

## 2016-03-23 MED ORDER — ROSUVASTATIN CALCIUM 20 MG PO TABS: 20.0000 mg | ORAL_TABLET | Freq: Every day | ORAL | 3 refills | Status: DC

## 2016-04-15 DIAGNOSIS — H2513 Age-related nuclear cataract, bilateral: Secondary | ICD-10-CM | POA: Diagnosis not present

## 2016-04-15 DIAGNOSIS — H353131 Nonexudative age-related macular degeneration, bilateral, early dry stage: Secondary | ICD-10-CM | POA: Diagnosis not present

## 2016-04-21 ENCOUNTER — Encounter: Payer: Self-pay | Admitting: Internal Medicine

## 2016-05-03 ENCOUNTER — Ambulatory Visit: Payer: Self-pay | Admitting: Internal Medicine

## 2016-05-04 ENCOUNTER — Encounter: Payer: Self-pay | Admitting: Internal Medicine

## 2016-05-04 ENCOUNTER — Ambulatory Visit (INDEPENDENT_AMBULATORY_CARE_PROVIDER_SITE_OTHER): Payer: BLUE CROSS/BLUE SHIELD | Admitting: Internal Medicine

## 2016-05-04 VITALS — BP 118/78 | HR 82 | Temp 98.2°F | Resp 16 | Ht 65.5 in | Wt 170.0 lb

## 2016-05-04 DIAGNOSIS — E291 Testicular hypofunction: Secondary | ICD-10-CM | POA: Diagnosis not present

## 2016-05-04 DIAGNOSIS — R7309 Other abnormal glucose: Secondary | ICD-10-CM | POA: Diagnosis not present

## 2016-05-04 DIAGNOSIS — Z23 Encounter for immunization: Secondary | ICD-10-CM | POA: Diagnosis not present

## 2016-05-04 DIAGNOSIS — I10 Essential (primary) hypertension: Secondary | ICD-10-CM

## 2016-05-04 DIAGNOSIS — R94120 Abnormal auditory function study: Secondary | ICD-10-CM | POA: Diagnosis not present

## 2016-05-04 DIAGNOSIS — E782 Mixed hyperlipidemia: Secondary | ICD-10-CM | POA: Diagnosis not present

## 2016-05-04 DIAGNOSIS — Z79899 Other long term (current) drug therapy: Secondary | ICD-10-CM

## 2016-05-04 DIAGNOSIS — N181 Chronic kidney disease, stage 1: Secondary | ICD-10-CM

## 2016-05-04 DIAGNOSIS — M545 Low back pain: Secondary | ICD-10-CM

## 2016-05-04 DIAGNOSIS — E349 Endocrine disorder, unspecified: Secondary | ICD-10-CM | POA: Diagnosis not present

## 2016-05-04 DIAGNOSIS — G8929 Other chronic pain: Secondary | ICD-10-CM

## 2016-05-04 MED ORDER — CYCLOBENZAPRINE HCL 10 MG PO TABS: 10.0000 mg | ORAL_TABLET | Freq: Three times a day (TID) | ORAL | 0 refills | Status: DC | PRN

## 2016-05-04 NOTE — Progress Notes (Signed)
Assessment and Plan:  Hypertension:  -Continue medication,  -monitor blood pressure at home.  -Continue DASH diet.   -Reminder to go to the ER if any CP, SOB, nausea, dizziness, severe HA, changes vision/speech, left arm numbness and tingling, and jaw pain.  Cholesterol: -cont crestor -Continue diet and exercise.   Pre-diabetes: -Continue diet and exercise.   Vitamin D Def: -continue medications.   Back pain -likely DDD vs. Possible arthritis of the spine -flexeril prn -continue core strengthening -cont weight management  Abnormal hearing test -cont ear protection at work -get another hearing test done at [employer]  Testosterone def -cont injections -recheck at physical  No bloodwork today as patient is about to have blood work done at work.  He will fax [place] a copy of this to be reviewed.     Continue diet and meds as discussed. Further disposition pending results of labs.  HPI 55 y.o. male  presents for 3 month follow up with hypertension, hyperlipidemia, prediabetes and vitamin D.   His blood pressure has been controlled at home, today their BP is BP: 118/78.   He does workout. He denies chest pain, shortness of breath, dizziness.  He is doing a lot of core work and is also doing some stretching.     He is on cholesterol medication and denies myalgias. His cholesterol is at goal. The cholesterol last visit was:   Lab Results  Component Value Date   CHOL 162 01/28/2016   HDL 34 (L) 01/28/2016   LDLCALC 78 01/28/2016   TRIG 249 (H) 01/28/2016   CHOLHDL 4.8 01/28/2016     He has been working on diet and exercise for prediabetes, and denies foot ulcerations, hyperglycemia, hypoglycemia , increased appetite, nausea, paresthesia of the feet, polydipsia, polyuria, visual disturbances, vomiting and weight loss. Last A1C in the office was:  Lab Results  Component Value Date   HGBA1C 5.2 01/28/2016    Patient is on Vitamin D supplement.  Lab Results  Component Value  Date   VD25OH 33 04/28/2015     Patient reports that he has been struggling a little bit with nasal congestion and allergies.  He reports that he is taking his claritin.    He reports that he did have some issues with his lower back.  He reports that he felt like he had some spasms with his lower back.  He reports that he did have to use some hydrocodone that he had left over at home.    His last testosterone injection was yesterday.  He reports that he has been doing really well with the medication.  He feels better.  He also notes that he had an abnormal hearing test at work.  He is planning to get another hearing test at [facility].     Current Medications:  Current Outpatient Prescriptions on File Prior to Visit  Medication Sig Dispense Refill  . Cholecalciferol (VITAMIN D PO) Take 4,000 Units by mouth.    . dexamethasone (DECADRON) 0.5 MG/5ML solution Take 0.5 mg by mouth 4 (four) times daily as needed.    Marland Kitchen lisinopril (PRINIVIL,ZESTRIL) 10 MG tablet Take 1 tablet (10 mg total) by mouth daily. 90 tablet 3  . meloxicam (MOBIC) 15 MG tablet Take 1 tablet (15 mg total) by mouth daily. 90 tablet 0  . Multiple Vitamins-Minerals (ICAPS AREDS 2 PO) Take 1 tablet by mouth 2 (two) times daily.    . Multiple Vitamins-Minerals (MULTIVITAMIN PO) Take by mouth.    . niacin 500  MG tablet Take 500 mg by mouth at bedtime.    . Omega 3-6-9 Fatty Acids (OMEGA 3-6-9 COMPLEX PO) Take by mouth daily.    . rosuvastatin (CRESTOR) 20 MG tablet Take 1 tablet (20 mg total) by mouth daily. 90 tablet 3  . Syringe/Needle, Disp, (SYRINGE 3CC/21GX1") 21G X 1" 3 ML MISC Use AD for testosterone cypionate injection 12 each 0  . testosterone cypionate (DEPOTESTOSTERONE CYPIONATE) 200 MG/ML injection Inject 0.5 mLs (100 mg total) into the muscle every 14 (fourteen) days. 10 mL 0  . valACYclovir (VALTREX) 1000 MG tablet Take 1 tablet (1,000 mg total) by mouth 2 (two) times daily. 60 tablet 0   No current  facility-administered medications on file prior to visit.     Medical History:  Past Medical History:  Diagnosis Date  . Chronic kidney disease   . Chronic lower back pain   . HSV-1 (herpes simplex virus 1) infection   . Hyperlipidemia   . Hypertension   . Insomnia     Allergies: No Known Allergies   Review of Systems:  Review of Systems  Constitutional: Negative for chills, fever and malaise/fatigue.  HENT: Negative for congestion, ear pain and sore throat.   Eyes: Negative.   Respiratory: Negative for cough, shortness of breath and wheezing.   Cardiovascular: Negative for chest pain, palpitations and leg swelling.  Gastrointestinal: Negative for abdominal pain, blood in stool, constipation, diarrhea, heartburn and melena.  Genitourinary: Negative.   Skin: Negative.   Neurological: Negative for dizziness, sensory change, loss of consciousness and headaches.  Psychiatric/Behavioral: Negative for depression. The patient is not nervous/anxious and does not have insomnia.     Family history- Review and unchanged  Social history- Review and unchanged  Physical Exam: BP 118/78   Pulse 82   Temp 98.2 F (36.8 C) (Temporal)   Resp 16   Ht 5' 5.5" (1.664 m)   Wt 170 lb (77.1 kg)   BMI 27.86 kg/m  Wt Readings from Last 3 Encounters:  05/04/16 170 lb (77.1 kg)  01/28/16 175 lb (79.4 kg)  12/07/15 176 lb (79.8 kg)    General Appearance: Well nourished well developed, in no apparent distress. Eyes: PERRLA, EOMs, conjunctiva no swelling or erythema ENT/Mouth: Ear canals normal without obstruction, swelling, erythma, discharge.  TMs normal bilaterally.  Oropharynx moist, clear, without exudate, or postoropharyngeal swelling. Neck: Supple, thyroid normal,no cervical adenopathy  Respiratory: Respiratory effort normal, Breath sounds clear A&P without rhonchi, wheeze, or rale.  No retractions, no accessory usage. Cardio: RRR with no MRGs. Brisk peripheral pulses without edema.   Abdomen: Soft, + BS,  Non tender, no guarding, rebound, hernias, masses. Musculoskeletal: Full ROM, 5/5 strength, Normal gait Skin: Warm, dry without rashes, lesions, ecchymosis.  Neuro: Awake and oriented X 3, Cranial nerves intact. Normal muscle tone, no cerebellar symptoms. Psych: Normal affect, Insight and Judgment appropriate.    Terri Piedraourtney Forcucci, PA-C 9:59 AM [facility]

## 2016-05-24 DIAGNOSIS — M545 Low back pain: Secondary | ICD-10-CM | POA: Diagnosis not present

## 2016-06-14 ENCOUNTER — Encounter: Payer: Self-pay | Admitting: Internal Medicine

## 2016-06-27 ENCOUNTER — Encounter: Payer: Self-pay | Admitting: Internal Medicine

## 2016-07-14 ENCOUNTER — Encounter: Payer: Self-pay | Admitting: Internal Medicine

## 2016-07-19 ENCOUNTER — Other Ambulatory Visit: Payer: Self-pay | Admitting: Internal Medicine

## 2016-07-19 MED ORDER — TESTOSTERONE 50 MG/5GM (1%) TD GEL: 5.0000 g | Freq: Every day | TRANSDERMAL | 3 refills | Status: DC

## 2016-08-02 ENCOUNTER — Ambulatory Visit (INDEPENDENT_AMBULATORY_CARE_PROVIDER_SITE_OTHER): Payer: BLUE CROSS/BLUE SHIELD | Admitting: Internal Medicine

## 2016-08-02 ENCOUNTER — Encounter: Payer: Self-pay | Admitting: Internal Medicine

## 2016-08-02 VITALS — BP 120/70 | HR 84 | Temp 98.2°F | Resp 16 | Ht 65.5 in | Wt 171.0 lb

## 2016-08-02 DIAGNOSIS — Z79899 Other long term (current) drug therapy: Secondary | ICD-10-CM | POA: Diagnosis not present

## 2016-08-02 DIAGNOSIS — Z1159 Encounter for screening for other viral diseases: Secondary | ICD-10-CM | POA: Diagnosis not present

## 2016-08-02 DIAGNOSIS — I10 Essential (primary) hypertension: Secondary | ICD-10-CM | POA: Diagnosis not present

## 2016-08-02 DIAGNOSIS — Z1329 Encounter for screening for other suspected endocrine disorder: Secondary | ICD-10-CM

## 2016-08-02 DIAGNOSIS — Z131 Encounter for screening for diabetes mellitus: Secondary | ICD-10-CM

## 2016-08-02 DIAGNOSIS — Z136 Encounter for screening for cardiovascular disorders: Secondary | ICD-10-CM | POA: Diagnosis not present

## 2016-08-02 DIAGNOSIS — Z0000 Encounter for general adult medical examination without abnormal findings: Secondary | ICD-10-CM | POA: Diagnosis not present

## 2016-08-02 DIAGNOSIS — E559 Vitamin D deficiency, unspecified: Secondary | ICD-10-CM | POA: Diagnosis not present

## 2016-08-02 LAB — CBC WITH DIFFERENTIAL/PLATELET
BASOS ABS: 0 {cells}/uL (ref 0–200)
Basophils Relative: 0 %
EOS PCT: 3 %
Eosinophils Absolute: 234 cells/uL (ref 15–500)
HEMATOCRIT: 50.8 % — AB (ref 38.5–50.0)
Hemoglobin: 16.9 g/dL (ref 13.2–17.1)
Lymphocytes Relative: 23 %
Lymphs Abs: 1794 cells/uL (ref 850–3900)
MCH: 31.1 pg (ref 27.0–33.0)
MCHC: 33.3 g/dL (ref 32.0–36.0)
MCV: 93.6 fL (ref 80.0–100.0)
MONO ABS: 780 {cells}/uL (ref 200–950)
MPV: 10.7 fL (ref 7.5–12.5)
Monocytes Relative: 10 %
NEUTROS PCT: 64 %
Neutro Abs: 4992 cells/uL (ref 1500–7800)
Platelets: 229 10*3/uL (ref 140–400)
RBC: 5.43 MIL/uL (ref 4.20–5.80)
RDW: 12.7 % (ref 11.0–15.0)
WBC: 7.8 10*3/uL (ref 3.8–10.8)

## 2016-08-02 LAB — HEPATITIS C ANTIBODY: HCV Ab: NEGATIVE

## 2016-08-02 MED ORDER — VALACYCLOVIR HCL 1 G PO TABS: 1000.0000 mg | ORAL_TABLET | Freq: Two times a day (BID) | ORAL | 0 refills | Status: DC

## 2016-08-02 MED ORDER — LISINOPRIL 10 MG PO TABS: 10.0000 mg | ORAL_TABLET | Freq: Every day | ORAL | 3 refills | Status: DC

## 2016-08-02 MED ORDER — ROSUVASTATIN CALCIUM 20 MG PO TABS: 20.0000 mg | ORAL_TABLET | Freq: Every day | ORAL | 3 refills | Status: DC

## 2016-08-02 NOTE — Progress Notes (Signed)
Complete Physical  Assessment and Plan: Patient recently had labs done at his work through [facility] and were sent via fax.  They are currently being scanned into the computer.   1. Routine general medical examination at a health care facility  - CBC with Differential/Platelet - BASIC METABOLIC PANEL WITH GFR - Hepatic function panel - Magnesium  2. Screening for diabetes mellitus  - Hemoglobin A1c  3. Screening for cardiovascular condition -normal EKG - EKG 12-Lead  4. Vitamin D deficiency -cont Vit D - VITAMIN D 25 Hydroxy (Vit-D Deficiency, Fractures)  5. Need for hepatitis C screening test  - Hepatitis C antibody  6. Screening for thyroid disorder  - TSH  7.  Testosterone deficiency -start androgel -recheck in 4 weeks at lab only visit   Discussed med's effects and SE's. Screening labs and tests as requested with regular follow-up as recommended.  HPI Patient presents for a complete physical.   His blood pressure has been controlled at home, today their BP is BP: 120/70 He does workout. He denies chest pain, shortness of breath, dizziness.  He reports that he is doing around 20 minutes of elliptical 3 days per week.  He reports that he is trying to lose another 10 lbs.     He is on cholesterol medication and denies myalgias. His cholesterol is at goal. The cholesterol last visit was:   Lab Results  Component Value Date   CHOL 162 01/28/2016   HDL 34 (L) 01/28/2016   LDLCALC 78 01/28/2016   TRIG 249 (H) 01/28/2016   CHOLHDL 4.8 01/28/2016    He has been working on diet and exercise for prediabetes, he is on bASA, he is on ACE/ARB and denies foot ulcerations, hyperglycemia, hypoglycemia , increased appetite, nausea, paresthesia of the feet, polydipsia, polyuria, visual disturbances, vomiting and weight loss. Last A1C in the office was:  Lab Results  Component Value Date   HGBA1C 5.2 01/28/2016    Patient is on Vitamin D supplement.   Lab Results   Component Value Date   VD25OH 33 04/28/2015      Last PSA was: No results found for: PSA.  Denies BPH symptoms daytime frequency, double voiding, dysuria, hematuria, hesitancy, incontinence, intermittency, nocturia, sensation of incomplete bladder emptying, suprapubic pain, urgency or weak urinary stream.   Patient has been off his testosterone for the last 4 weeks while we attempted to transition to androgel.   He reports that he was just able to pick up his prescription yesterday.  He will start it tomorrow.  He will come back in 4 weeks for a lab only testosterone recheck.  He still has some shoulder and back pain which is well controlled with his physical therapy exercises.  He reports that he does not regularly take the meloxicam or flexeril only when he really needs it.    He has no other complaints at this time.    Current Medications:  Current Outpatient Prescriptions on File Prior to Visit  Medication Sig Dispense Refill  . Cholecalciferol (VITAMIN D PO) Take 4,000 Units by mouth.    . cyclobenzaprine (FLEXERIL) 10 MG tablet Take 1 tablet (10 mg total) by mouth 3 (three) times daily as needed for muscle spasms. 30 tablet 0  . dexamethasone (DECADRON) 0.5 MG/5ML solution Take 0.5 mg by mouth 4 (four) times daily as needed.    Marland Kitchen lisinopril (PRINIVIL,ZESTRIL) 10 MG tablet Take 1 tablet (10 mg total) by mouth daily. 90 tablet 3  . meloxicam (MOBIC) 15  MG tablet Take 1 tablet (15 mg total) by mouth daily. 90 tablet 0  . Multiple Vitamins-Minerals (ICAPS AREDS 2 PO) Take 1 tablet by mouth 2 (two) times daily.    . Multiple Vitamins-Minerals (MULTIVITAMIN PO) Take by mouth.    . niacin 500 MG tablet Take 500 mg by mouth at bedtime.    . Omega 3-6-9 Fatty Acids (OMEGA 3-6-9 COMPLEX PO) Take by mouth daily.    . rosuvastatin (CRESTOR) 20 MG tablet Take 1 tablet (20 mg total) by mouth daily. 90 tablet 3  . testosterone (ANDROGEL) 50 MG/5GM (1%) GEL Place 5 g onto the skin daily. Apply to  the upper shoulder, thigh, or back.  Cover with clothing immediately after drying to avoid transfer. 150 g 3  . valACYclovir (VALTREX) 1000 MG tablet Take 1 tablet (1,000 mg total) by mouth 2 (two) times daily. 60 tablet 0   No current facility-administered medications on file prior to visit.     Health Maintenance:  Immunization History  Administered Date(s) Administered  . Tdap 05/04/2016    Tetanus: 05/04/16 Flu vaccine:  9/17 Colonoscopy: Due in 2019 for 5 year recheck, first degree relative with colon cancer  Patient Care Team: Lucky Cowboy, MD as PCP - General (Internal Medicine) Sharrell Ku, MD as Consulting Physician (Gastroenterology)  Allergies: No Known Allergies  Medical History:  Past Medical History:  Diagnosis Date  . Chronic kidney disease   . Chronic lower back pain   . HSV-1 (herpes simplex virus 1) infection   . Hyperlipidemia   . Hypertension   . Insomnia     Surgical History: No past surgical history on file.  Family History: No family history on file.  Social History:   Social History  Substance Use Topics  . Smoking status: Never Smoker  . Smokeless tobacco: Not on file  . Alcohol use Not on file    Review of Systems:  Review of Systems  Constitutional: Negative for chills, fever and malaise/fatigue.  HENT: Negative for congestion, ear pain and sore throat.   Eyes: Negative.   Respiratory: Negative for cough, shortness of breath and wheezing.   Cardiovascular: Negative for chest pain, palpitations and leg swelling.  Gastrointestinal: Negative for abdominal pain, blood in stool, constipation, diarrhea, heartburn and melena.  Genitourinary: Negative.   Skin: Negative.   Neurological: Negative for dizziness, sensory change, loss of consciousness and headaches.  Psychiatric/Behavioral: Negative for depression. The patient is not nervous/anxious and does not have insomnia.     Physical Exam: Estimated body mass index is 28.02 kg/m as  calculated from the following:   Height as of this encounter: 5' 5.5" (1.664 m).   Weight as of this encounter: 171 lb (77.6 kg). BP 120/70   Pulse 84   Temp 98.2 F (36.8 C) (Temporal)   Resp 16   Ht 5' 5.5" (1.664 m)   Wt 171 lb (77.6 kg)   BMI 28.02 kg/m   General Appearance: Well nourished, in no apparent distress. Alert, attentive. Eyes: PERRLA, EOMs, conjunctiva no swelling or erythema ENT/Mouth: Ear canals clear bilaterally with no erythema, swelling, discharge.  TMs normal bilaterally with no erythema, bulging, or retractions.  Oropharynx clear and moist with no exudate, swelling, or erythema.  Dentition normal.   Neck: Supple, thyroid normal. No bruits, JVD, cervical adenopathy Respiratory: Respiratory effort normal, BS equal bilaterally without rales, rhonchi, wheezing or stridor.  Cardio: RRR without murmurs, rubs or gallops. Brisk peripheral pulses without edema. No bruits in the bilateral  carotids.   Chest: symmetric, with normal excursions Abdomen: Soft, nontender, no guarding, rebound, hernias, masses, or organomegaly. Musculoskeletal: Full ROM all peripheral extremities,5/5 strength, and normal gait.  Skin: Warm, dry without rashes, lesions, ecchymosis. Neuro: A&Ox3, Cranial nerves intact, reflexes equal bilaterally. Normal muscle tone, no cerebellar symptoms. Sensation intact.  Psych: Normal affect, Insight and Judgment appropriate.   EKG: WNL no changes.  Over 40 minutes of exam, counseling, chart review and critical decision making was performed  Terri Piedraourtney Forcucci 9:18 AM [facility]

## 2016-08-03 LAB — MAGNESIUM: Magnesium: 2 mg/dL (ref 1.5–2.5)

## 2016-08-03 LAB — HEPATIC FUNCTION PANEL
ALT: 41 U/L (ref 9–46)
AST: 28 U/L (ref 10–35)
Albumin: 4.7 g/dL (ref 3.6–5.1)
Alkaline Phosphatase: 59 U/L (ref 40–115)
BILIRUBIN TOTAL: 0.5 mg/dL (ref 0.2–1.2)
Bilirubin, Direct: 0.1 mg/dL (ref <=0.2)
Indirect Bilirubin: 0.4 mg/dL (ref 0.2–1.2)
Total Protein: 6.8 g/dL (ref 6.1–8.1)

## 2016-08-03 LAB — BASIC METABOLIC PANEL WITH GFR
BUN: 20 mg/dL (ref 7–25)
CALCIUM: 9.9 mg/dL (ref 8.6–10.3)
CHLORIDE: 101 mmol/L (ref 98–110)
CO2: 25 mmol/L (ref 20–31)
CREATININE: 1.15 mg/dL (ref 0.70–1.33)
GFR, Est African American: 81 mL/min (ref >=60)
GFR, Est Non African American: 70 mL/min (ref >=60)
Glucose, Bld: 97 mg/dL (ref 65–99)
Potassium: 5.1 mmol/L (ref 3.5–5.3)
Sodium: 139 mmol/L (ref 135–146)

## 2016-08-03 LAB — HEMOGLOBIN A1C
HEMOGLOBIN A1C: 5.2 % (ref <5.7)
Mean Plasma Glucose: 103 mg/dL

## 2016-08-03 LAB — TSH: TSH: 0.67 mIU/L (ref 0.40–4.50)

## 2016-08-03 LAB — VITAMIN D 25 HYDROXY (VIT D DEFICIENCY, FRACTURES): VIT D 25 HYDROXY: 41 ng/mL (ref 30–100)

## 2016-08-06 ENCOUNTER — Encounter: Payer: Self-pay | Admitting: *Deleted

## 2016-08-09 DIAGNOSIS — Z1283 Encounter for screening for malignant neoplasm of skin: Secondary | ICD-10-CM | POA: Diagnosis not present

## 2016-08-09 DIAGNOSIS — X32XXXA Exposure to sunlight, initial encounter: Secondary | ICD-10-CM | POA: Diagnosis not present

## 2016-08-09 DIAGNOSIS — K120 Recurrent oral aphthae: Secondary | ICD-10-CM | POA: Diagnosis not present

## 2016-08-09 DIAGNOSIS — L720 Epidermal cyst: Secondary | ICD-10-CM | POA: Diagnosis not present

## 2016-08-09 DIAGNOSIS — L570 Actinic keratosis: Secondary | ICD-10-CM | POA: Diagnosis not present

## 2016-08-30 ENCOUNTER — Other Ambulatory Visit: Payer: BLUE CROSS/BLUE SHIELD

## 2016-08-30 DIAGNOSIS — E291 Testicular hypofunction: Secondary | ICD-10-CM

## 2016-08-31 LAB — TESTOSTERONE: TESTOSTERONE: 867 ng/dL — AB (ref 250–827)

## 2016-08-31 NOTE — Progress Notes (Signed)
LVM for pt to return office call for LAB results.

## 2016-09-06 ENCOUNTER — Encounter: Payer: Self-pay | Admitting: Internal Medicine

## 2016-09-07 ENCOUNTER — Encounter: Payer: Self-pay | Admitting: Internal Medicine

## 2016-09-07 ENCOUNTER — Ambulatory Visit (INDEPENDENT_AMBULATORY_CARE_PROVIDER_SITE_OTHER): Payer: BLUE CROSS/BLUE SHIELD | Admitting: Internal Medicine

## 2016-09-07 VITALS — BP 138/84 | HR 98 | Temp 98.2°F | Resp 16 | Ht 65.5 in | Wt 170.0 lb

## 2016-09-07 DIAGNOSIS — Z113 Encounter for screening for infections with a predominantly sexual mode of transmission: Secondary | ICD-10-CM

## 2016-09-07 DIAGNOSIS — B081 Molluscum contagiosum: Secondary | ICD-10-CM | POA: Diagnosis not present

## 2016-09-07 NOTE — Progress Notes (Signed)
Assessment and Plan:   1. Screen for STD (sexually transmitted disease) -given recent schizophrenic syndrome in his wife and abnormal behavior will screen for STDs - HIV antibody - HSV(herpes simplex vrs) 1+2 ab-IgG - GC/chlamydia probe amp, urine - RPR  2. Molluscum contagiosum -rash appears most consistent with molluscum contagiosum -patient educated that this is benign and recommended that we do watchful waiting. -will likely be self limited.  If new symptoms patient to return to office -patient reassured.     HPI 56 y.o.male presents for evaluation of bumps on his penis for the last week.  He reports that they may have been there approximately 30 days ago.  He reports that they may be slightly larger than they were 30 days ago.  He reports that they started approximately the same time he started using the testosterone gel.  No drainage, itching, burning, or issues.  He has never been tested for genital herpes.  No testicular pain, urinating normally.  He has never been tested for this in the past.    Past Medical History:  Diagnosis Date  . Chronic kidney disease   . Chronic lower back pain   . HSV-1 (herpes simplex virus 1) infection   . Hyperlipidemia   . Hypertension   . Insomnia      No Known Allergies    Current Outpatient Prescriptions on File Prior to Visit  Medication Sig Dispense Refill  . Cholecalciferol (VITAMIN D PO) Take 4,000 Units by mouth.    . cyclobenzaprine (FLEXERIL) 10 MG tablet Take 1 tablet (10 mg total) by mouth 3 (three) times daily as needed for muscle spasms. 30 tablet 0  . dexamethasone (DECADRON) 0.5 MG/5ML solution Take 0.5 mg by mouth 4 (four) times daily as needed.    Marland Kitchen. lisinopril (PRINIVIL,ZESTRIL) 10 MG tablet Take 1 tablet (10 mg total) by mouth daily. 90 tablet 3  . meloxicam (MOBIC) 15 MG tablet Take 1 tablet (15 mg total) by mouth daily. 90 tablet 0  . Multiple Vitamins-Minerals (ICAPS AREDS 2 PO) Take 1 tablet by mouth 2 (two) times  daily.    . Multiple Vitamins-Minerals (MULTIVITAMIN PO) Take by mouth.    . niacin 500 MG tablet Take 500 mg by mouth at bedtime.    . Omega 3-6-9 Fatty Acids (OMEGA 3-6-9 COMPLEX PO) Take by mouth daily.    Marland Kitchen. testosterone (ANDROGEL) 50 MG/5GM (1%) GEL Place 5 g onto the skin daily. Apply to the upper shoulder, thigh, or back.  Cover with clothing immediately after drying to avoid transfer. 150 g 3  . valACYclovir (VALTREX) 1000 MG tablet Take 1 tablet (1,000 mg total) by mouth 2 (two) times daily. 60 tablet 0  . rosuvastatin (CRESTOR) 20 MG tablet Take 1 tablet (20 mg total) by mouth daily. (Patient not taking: Reported on 09/07/2016) 90 tablet 3   No current facility-administered medications on file prior to visit.     Review of Systems  Constitutional: Negative for chills, fever and malaise/fatigue.  Gastrointestinal: Negative for abdominal pain, nausea and vomiting.  Genitourinary: Negative for dysuria, flank pain, frequency, hematuria and urgency.       Skin lesions on the penis, no ulcers, testicular swelling, scrotal swelling, pain, or penile discharge  '  Physical Exam: Filed Weights   09/07/16 1344  Weight: 170 lb (77.1 kg)   BP 138/84   Pulse 98   Temp 98.2 F (36.8 C) (Temporal)   Resp 16   Ht 5' 5.5" (1.664 m)  Wt 170 lb (77.1 kg)   BMI 27.86 kg/m  General Appearance: Well developed well nourished, non-toxic appearing in no apparent distress. Eyes: PERRLA, EOMs, conjunctiva w/ no swelling or erythema or discharge Sinuses: No Frontal/maxillary tenderness ENT/Mouth: Ear canals clear without swelling or erythema.  TM's normal bilaterally with no retractions, bulging, or loss of landmarks.   Neck: Supple, thyroid normal, no notable JVD  Respiratory: Respiratory effort normal, Clear breath sounds anteriorly and posteriorly bilaterally without rales, rhonchi, wheezing or stridor. No retractions or accessory muscle usage. Cardio: RRR with no MRGs.   Abdomen: Soft, + BS.   Non tender, no guarding, rebound, hernias, masses.  Musculoskeletal: Full ROM, 5/5 strength, normal gait.  GU:  Papular flesh colored lesions to the base of the penile shaft and the skin surrounding which are non-tender to palpation.  There is central umbilication is some of the papules without redness or erythema.  Penis is non-tender to palpation, no expressible discharge.  No testicular tenderness.  Normal epididymal exam.   Skin: Warm, dry without rashes  Neuro: Awake and oriented X 3, Cranial nerves intact. Normal muscle tone, no cerebellar symptoms. Sensation intact.  Psych: normal affect, Insight and Judgment appropriate.     Terri Piedra, PA-C 1:59 PM [facility]

## 2016-09-07 NOTE — Patient Instructions (Signed)
Molluscum Contagiosum, Adult Introduction Molluscum contagiosum is a skin infection that can cause a rash. When molluscum contagiosum affects the genital area, it is called a sexually transmitted disease (STD). What are the causes? Molluscum contagiosum is caused by a virus. The virus can spread easily from person to person through:  Skin-to-skin contact with an infected person.  Contact with an infected object, such as a towel or clothing. What increases the risk? You may be at higher risk for molluscum contagiosum if you:  Live in an area where the weather is moist and warm.  Have a weak body defense system (immune system). What are the signs or symptoms? The main symptom is a rash that appears 2-7 weeks after exposure to the virus. It is made up of 2-20 small, firm, dome-shaped bumps that may:  Be pink or flesh-colored.  Appear alone or in groups.  Range from the size of a pinhead to the size of a pencil eraser.  Feel smooth and waxy.  Have a pit in the middle.  Itch. The rash does not itch for most people. The bumps often appears on the genitals, thighs, face, neck, and abdomen. How is this diagnosed? A health care provider can usually diagnose molluscum contagiosum by looking at the bumps on your skin. To confirm the diagnosis, your health care provider may scrape the bumps to collect a skin sample to examine under a microscope. How is this treated? The bumps may go away on their own, but people often have treatment to keep the virus from infecting someone else or to keep the rash from spreading to other body parts. Treatment may include:  Surgery to remove the bumps by freezing them (cryosurgery).  A procedure to scrape off the bumps (curettage).  A procedure to remove the bumps with a laser.  Putting medicine on the bumps (topical treatment). Sometimes no treatment is needed. Follow these instructions at home:  Take medicines only as directed by your health care  provider.  As long as you have bumps on your skin, the infection can spread to others and to other parts of your body. To prevent this from happening:  Do not scratch the bumps.  Do not share clothing or towels with others until the bumps disappear.  Avoid close contact with others until the bumps disappear. This includes sexual contact.  Wash your hands often.  Cover the bumps with clothing or a bandage when you will be near other people. Contact a health care provider if:  The bumps are spreading.  The bumps are becoming red and sore.  The bumps have not gone away after 12 months. This information is not intended to replace advice given to you by your health care provider. Make sure you discuss any questions you have with your health care provider. Document Released: 02/12/2014 Document Revised: 12/24/2015 Document Reviewed: 12/25/2013  2017 [employer]vier

## 2016-09-08 ENCOUNTER — Other Ambulatory Visit: Payer: Self-pay | Admitting: Internal Medicine

## 2016-09-08 ENCOUNTER — Ambulatory Visit: Payer: BLUE CROSS/BLUE SHIELD | Admitting: Internal Medicine

## 2016-09-08 DIAGNOSIS — Z113 Encounter for screening for infections with a predominantly sexual mode of transmission: Secondary | ICD-10-CM

## 2016-09-08 LAB — RPR

## 2016-09-08 LAB — HSV(HERPES SIMPLEX VRS) I + II AB-IGG

## 2016-09-08 LAB — HIV ANTIBODY (ROUTINE TESTING W REFLEX): HIV 1&2 Ab, 4th Generation: NONREACTIVE

## 2016-09-08 NOTE — Addendum Note (Signed)
Addended by: Caryl AspARANT, Valari Taylor N on: 09/08/2016 04:33 PM   Modules accepted: Orders

## 2016-09-08 NOTE — Progress Notes (Signed)
Patient brought urine sample back for testing today

## 2016-09-09 LAB — GC/CHLAMYDIA PROBE AMP
CT Probe RNA: NOT DETECTED
GC PROBE AMP APTIMA: NOT DETECTED

## 2016-11-23 ENCOUNTER — Encounter: Payer: Self-pay | Admitting: Internal Medicine

## 2016-11-23 ENCOUNTER — Other Ambulatory Visit: Payer: Self-pay | Admitting: Internal Medicine

## 2016-11-23 MED ORDER — TESTOSTERONE 20.25 MG/ACT (1.62%) TD GEL: TRANSDERMAL | 2 refills | Status: DC

## 2016-12-14 ENCOUNTER — Encounter: Payer: Self-pay | Admitting: *Deleted

## 2017-01-02 ENCOUNTER — Other Ambulatory Visit: Payer: Self-pay | Admitting: Physician Assistant

## 2017-01-02 ENCOUNTER — Other Ambulatory Visit: Payer: Self-pay | Admitting: Internal Medicine

## 2017-01-02 DIAGNOSIS — N489 Disorder of penis, unspecified: Secondary | ICD-10-CM

## 2017-01-24 DIAGNOSIS — A630 Anogenital (venereal) warts: Secondary | ICD-10-CM | POA: Diagnosis not present

## 2017-01-25 ENCOUNTER — Other Ambulatory Visit: Payer: Self-pay | Admitting: Urology

## 2017-02-07 ENCOUNTER — Ambulatory Visit: Payer: Self-pay | Admitting: Internal Medicine

## 2017-02-07 ENCOUNTER — Encounter: Payer: Self-pay | Admitting: Internal Medicine

## 2017-02-07 ENCOUNTER — Ambulatory Visit (INDEPENDENT_AMBULATORY_CARE_PROVIDER_SITE_OTHER): Payer: BLUE CROSS/BLUE SHIELD | Admitting: Internal Medicine

## 2017-02-07 VITALS — BP 136/80 | HR 80 | Temp 97.3°F | Resp 16 | Ht 65.5 in | Wt 171.8 lb

## 2017-02-07 DIAGNOSIS — M51369 Other intervertebral disc degeneration, lumbar region without mention of lumbar back pain or lower extremity pain: Secondary | ICD-10-CM

## 2017-02-07 DIAGNOSIS — E8881 Metabolic syndrome: Secondary | ICD-10-CM | POA: Insufficient documentation

## 2017-02-07 DIAGNOSIS — E782 Mixed hyperlipidemia: Secondary | ICD-10-CM

## 2017-02-07 DIAGNOSIS — E349 Endocrine disorder, unspecified: Secondary | ICD-10-CM | POA: Diagnosis not present

## 2017-02-07 DIAGNOSIS — R7309 Other abnormal glucose: Secondary | ICD-10-CM | POA: Insufficient documentation

## 2017-02-07 DIAGNOSIS — E88819 Insulin resistance, unspecified: Secondary | ICD-10-CM

## 2017-02-07 DIAGNOSIS — M5136 Other intervertebral disc degeneration, lumbar region: Secondary | ICD-10-CM | POA: Diagnosis not present

## 2017-02-07 DIAGNOSIS — Z79899 Other long term (current) drug therapy: Secondary | ICD-10-CM | POA: Diagnosis not present

## 2017-02-07 DIAGNOSIS — E559 Vitamin D deficiency, unspecified: Secondary | ICD-10-CM

## 2017-02-07 DIAGNOSIS — I10 Essential (primary) hypertension: Secondary | ICD-10-CM | POA: Diagnosis not present

## 2017-02-07 DIAGNOSIS — G4733 Obstructive sleep apnea (adult) (pediatric): Secondary | ICD-10-CM | POA: Diagnosis not present

## 2017-02-07 LAB — CBC WITH DIFFERENTIAL/PLATELET
BASOS ABS: 0 {cells}/uL (ref 0–200)
Basophils Relative: 0 %
EOS ABS: 246 {cells}/uL (ref 15–500)
Eosinophils Relative: 3 %
HEMATOCRIT: 49.8 % (ref 38.5–50.0)
HEMOGLOBIN: 16.7 g/dL (ref 13.2–17.1)
Lymphocytes Relative: 30 %
Lymphs Abs: 2460 cells/uL (ref 850–3900)
MCH: 31.8 pg (ref 27.0–33.0)
MCHC: 33.5 g/dL (ref 32.0–36.0)
MCV: 94.9 fL (ref 80.0–100.0)
MONO ABS: 820 {cells}/uL (ref 200–950)
MPV: 10.5 fL (ref 7.5–12.5)
Monocytes Relative: 10 %
NEUTROS PCT: 57 %
Neutro Abs: 4674 cells/uL (ref 1500–7800)
Platelets: 223 10*3/uL (ref 140–400)
RBC: 5.25 MIL/uL (ref 4.20–5.80)
RDW: 12.9 % (ref 11.0–15.0)
WBC: 8.2 10*3/uL (ref 3.8–10.8)

## 2017-02-07 MED ORDER — BISOPROLOL-HYDROCHLOROTHIAZIDE 5-6.25 MG PO TABS: ORAL_TABLET | ORAL | 1 refills | Status: DC

## 2017-02-07 NOTE — Progress Notes (Signed)
This very nice 56 y.o. MWM presents for 6 month follow up with Hypertension, Hyperlipidemia, Pre-Diabetes and Vitamin D Deficiency. Patient does report snoring and poor sleep hygiene and excess daytime fatigue.     Patient is followed for  HTN (2011) & BP has been intermittently elevated at home at home on Lisinopril. Today's BP is at goal 136/80 (right-136/80 and left-128/80). Patient has had no complaints of any cardiac type chest pain, palpitations, dyspnea/orthopnea/PND, dizziness, claudication, or dependent edema.  BP Readings from Last 3 Encounters:  02/07/17 136/80  09/07/16 138/84  08/02/16 120/70      Hyperlipidemia is controlled with diet & meds. Patient denies myalgias or other med SE's. Last Lipids were at goal albeit elevated Trig's: Lab Results  Component Value Date   CHOL 162 01/28/2016   HDL 34 (L) 01/28/2016   LDLCALC 78 01/28/2016   TRIG 249 (H) 01/28/2016   CHOLHDL 4.8 01/28/2016      Also, the patient has history of PreDiabetes and has had no symptoms of reactive hypoglycemia, diabetic polys, paresthesias or visual blurring.  Last A1c was at goal: Lab Results  Component Value Date   HGBA1C 5.2 08/02/2016      Further, the patient also has history of Vitamin D Deficiency and supplements vitamin D without any suspected side-effects. Last vitamin D was still low: Lab Results  Component Value Date   VD25OH 41 08/02/2016   Current Outpatient Prescriptions on File Prior to Visit  Medication Sig  . VITAMIN D Take 4,000 Units by mouth.  . cyclobenzaprine  10 MG tablet Take 1 tablet (10 mg total) by mouth 3 (three) times daily as needed for muscle spasms.  Marland Kitchen. dexamethasone  0.5 MG/5ML soln Take 0.5 mg by mouth 4 (four) times daily as needed.  Marland Kitchen. lisinopril  10 MG tablet Take 1 tablet (10 mg total) by mouth daily.  . meloxicam 15 MG tablet Take 1 tablet (15 mg total) by mouth daily.  . ICAPS -  AREDS 2 Take 1 tablet by mouth 2 (two) times daily.  . Multiple  Vitamins-Minerals Take by mouth.  . niacin 500 MG tablet Take 500 mg by mouth at bedtime.  . OMEGA 3-6-9 COMPLEX  Take by mouth daily.  . rosuvastatin 20 MG tablet Take 1 tablet (20 mg total) by mouth daily.  . valACYclovir 1000 MG tablet Take 1 tablet (1,000 mg total) by mouth 2 (two) times daily.   No Known Allergies   PMHx:   Past Medical History:  Diagnosis Date  . Chronic kidney disease   . Chronic lower back pain   . HSV-1 (herpes simplex virus 1) infection   . Hyperlipidemia   . Hypertension   . Insomnia    Immunization History  Administered Date(s) Administered  . Tdap 05/04/2016   History reviewed. No pertinent surgical history. FHx:    Reviewed / unchanged  SHx:    Reviewed / unchanged  Systems Review:  Constitutional: Denies fever, chills, wt changes, headaches, insomnia, fatigue, night sweats, change in appetite. Eyes: Denies redness, blurred vision, diplopia, discharge, itchy, watery eyes.  ENT: Denies discharge, congestion, post nasal drip, epistaxis, sore throat, earache, hearing loss, dental pain, tinnitus, vertigo, sinus pain, snoring.  CV: Denies chest pain, palpitations, irregular heartbeat, syncope, dyspnea, diaphoresis, orthopnea, PND, claudication or edema. Respiratory: denies cough, dyspnea, DOE, pleurisy, hoarseness, laryngitis, wheezing.  Gastrointestinal: Denies dysphagia, odynophagia, heartburn, reflux, water brash, abdominal pain or cramps, nausea, vomiting, bloating, diarrhea, constipation, hematemesis, melena, hematochezia  or hemorrhoids. Genitourinary: Denies dysuria, frequency, urgency, nocturia, hesitancy, discharge, hematuria or flank pain. Musculoskeletal: Denies arthralgias, myalgias, stiffness, jt. swelling, pain, limping or strain/sprain.  Skin: Denies pruritus, rash, hives, warts, acne, eczema or change in skin lesion(s). Neuro: No weakness, tremor, incoordination, spasms, paresthesia or pain. Psychiatric: Denies confusion, memory loss or  sensory loss. Endo: Denies change in weight, skin or hair change.  Heme/Lymph: No excessive bleeding, bruising or enlarged lymph nodes.  Physical Exam  BP 136/80 Comment: right-136/80 and left-128/80  Pulse 80   Temp (!) 97.3 F (36.3 C)   Resp 16   Ht 5' 5.5" (1.664 m)   Wt 171 lb 12.8 oz (77.9 kg)   BMI 28.15 kg/m   Appears well nourished, well groomed  and in no distress.  Eyes: PERRLA, EOMs, conjunctiva no swelling or erythema. Sinuses: No frontal/maxillary tenderness ENT/Mouth: EAC's clear, TM's nl w/o erythema, bulging. Nares clear w/o erythema, swelling, exudates. Oropharynx clear without erythema or exudates. Oral hygiene is good. Mallampati 2. Hearing intact.  Neck: Supple. Thyroid nl. Car 2+/2+ without bruits, nodes or JVD. Chest: Respirations nl with BS clear & equal w/o rales, rhonchi, wheezing or stridor.  Cor: Heart sounds normal w/ regular rate and rhythm without sig. murmurs, gallops, clicks or rubs. Peripheral pulses normal and equal  without edema.  Abdomen: Soft & bowel sounds normal. Non-tender w/o guarding, rebound, hernias, masses or organomegaly.  Lymphatics: Unremarkable.  Musculoskeletal: Full ROM all peripheral extremities, joint stability, 5/5 strength and normal gait.  Skin: Warm, dry without exposed rashes, lesions or ecchymosis apparent.  Neuro: Cranial nerves intact, reflexes equal bilaterally. Sensory-motor testing grossly intact. Tendon reflexes grossly intact.  Pysch: Alert & oriented x 3.  Insight and judgement nl & appropriate. No ideations.  Assessment and Plan:  1. Essential hypertension  - Continue medication, monitor blood pressure at home.  - Continue DASH diet. Reminder to go to the ER if any CP,  SOB, nausea, dizziness, severe HA, changes vision/speech.  - CBC with Differential/Platelet - BASIC METABOLIC PANEL WITH GFR - Magnesium - TSH  - bisoprolol-hydrochlorothiazide (ZIAC) 5-6.25 MG tablet; Take 1 tablet daily for BP   Dispense: 90 tablet; Refill: 1  2. Hyperlipidemia, mixed  - Continue diet/meds, exercise,& lifestyle modifications.  - Continue monitor periodic cholesterol/liver & renal functions   - Hepatic function panel - Lipid panel - TSH  3. Other abnormal glucose  - Continue diet, exercise, lifestyle modifications.  - Monitor appropriate labs.  - Hemoglobin A1c - Insulin, random  4. Vitamin D deficiency  - Continue supplementation.  - VITAMIN D 25 Hydroxy   5. Insulin resistance  - Hemoglobin A1c - Insulin, random  6. Testosterone deficiency  - Testosterone  7. DDD (degenerative disc disease), lumbar   8. OSA (obstructive sleep apnea)  - Ambulatory referral to Sleep Studies  9. Medication management  - CBC with Differential/Platelet - BASIC METABOLIC PANEL WITH GFR - Hepatic function panel - Magnesium - Lipid panel - TSH - Hemoglobin A1c - Insulin, random - VITAMIN D 25 Hydroxy  - Testosterone       Discussed  regular exercise, BP monitoring, weight control to achieve/maintain BMI less than 25 and discussed med and SE's. Recommended labs to assess and monitor clinical status with further disposition pending results of labs. Over 30 minutes of exam, counseling, chart review was performed.

## 2017-02-07 NOTE — Patient Instructions (Addendum)
Sleep Apnea Sleep apnea is a condition in which breathing pauses or becomes shallow during sleep. Episodes of sleep apnea usually last 10 seconds or longer, and they may occur as many as 20 times an hour. Sleep apnea disrupts your sleep and keeps your body from getting the rest that it needs. This condition can increase your risk of certain health problems, including:  Heart attack.  Stroke.  Obesity.  Diabetes.  Heart failure.  Irregular heartbeat.  There are three kinds of sleep apnea:  Obstructive sleep apnea. This kind is caused by a blocked or collapsed airway.  Central sleep apnea. This kind happens when the part of the brain that controls breathing does not send the correct signals to the muscles that control breathing.  Mixed sleep apnea. This is a combination of obstructive and central sleep apnea.  What are the causes? The most common cause of this condition is a collapsed or blocked airway. An airway can collapse or become blocked if:  Your throat muscles are abnormally relaxed.  Your tongue and tonsils are larger than normal.  You are overweight.  Your airway is smaller than normal.  What increases the risk? This condition is more likely to develop in people who:  Are overweight.  Smoke.  Have a smaller than normal airway.  Are elderly.  Are male.  Drink alcohol.  Take sedatives or tranquilizers.  Have a family history of sleep apnea.  What are the signs or symptoms? Symptoms of this condition include:  Trouble staying asleep.  Daytime sleepiness and tiredness.  Irritability.  Loud snoring.  Morning headaches.  Trouble concentrating.  Forgetfulness.  Decreased interest in sex.  Unexplained sleepiness.  Mood swings.  Personality changes.  Feelings of depression.  Waking up often during the night to urinate.  Dry mouth.  Sore throat.  How is this diagnosed? This condition may be diagnosed with:  A medical history.  A  physical exam.  A series of tests that are done while you are sleeping (sleep study). These tests are usually done in a sleep lab, but they may also be done at home.  How is this treated? Treatment for this condition aims to restore normal breathing and to ease symptoms during sleep. It may involve managing health issues that can affect breathing, such as high blood pressure or obesity. Treatment may include:  Sleeping on your side.  Using a decongestant if you have nasal congestion.  Avoiding the use of depressants, including alcohol, sedatives, and narcotics.  Losing weight if you are overweight.  Making changes to your diet.  Quitting smoking.  Using a device to open your airway while you sleep, such as: ? An oral appliance. This is a custom-made mouthpiece that shifts your lower jaw forward. ? A continuous positive airway pressure (CPAP) device. This device delivers oxygen to your airway through a mask. ? A nasal expiratory positive airway pressure (EPAP) device. This device has valves that you put into each nostril. ? A bi-level positive airway pressure (BPAP) device. This device delivers oxygen to your airway through a mask.  Surgery if other treatments do not work. During surgery, excess tissue is removed to create a wider airway.  It is important to get treatment for sleep apnea. Without treatment, this condition can lead to:  High blood pressure.  Coronary artery disease.  (Men) An inability to achieve or maintain an erection (impotence).  Reduced thinking abilities.  Follow these instructions at home:  Make any lifestyle changes that your health   care provider recommends.  Eat a healthy, well-balanced diet.  Take over-the-counter and prescription medicines only as told by your health care provider.  Avoid using depressants, including alcohol, sedatives, and narcotics.  Take steps to lose weight if you are overweight.  If you were given a device to open your  airway while you sleep, use it only as told by your health care provider.  Do not use any tobacco products, such as cigarettes, chewing tobacco, and e-cigarettes. If you need help quitting, ask your health care provider.  Keep all follow-up visits as told by your health care provider. This is important. Contact a health care provider if:  The device that you received to open your airway during sleep is uncomfortable or does not seem to be working.  Your symptoms do not improve.  Your symptoms get worse. Get help right away if:  You develop chest pain.  You develop shortness of breath.  You develop discomfort in your back, arms, or stomach.  You have trouble speaking.  You have weakness on one side of your body.  You have drooping in your face. These symptoms may represent a serious problem that is an emergency. Do not wait to see if the symptoms will go away. Get medical help right away. Call your local emergency services (911 in the [place] Do not drive yourself to the hospital. This information is not intended to replace advice given to you by your health care provider. Make sure you discuss any questions you have with your health care provider. Document Released: 07/08/2002 Document Revised: 03/13/2016 Document Reviewed: 04/27/2015 Elsevier Interactive Patient Education  2018 [employer]  ++++++++++++++++++++++++++++ Recommend Adult Low Dose Aspirin or  coated  Aspirin 81 mg daily  To reduce risk of Colon Cancer 20 %,  Skin Cancer 26 % ,  Melanoma 46%  and  Pancreatic cancer 60% +++++++++++++++++++++++++ Vitamin D goal  is between 70-100.  Please make sure that you are taking your Vitamin D as directed.  It is very important as a natural anti-inflammatory  helping hair, skin, and nails, as well as reducing stroke and heart attack risk.  It helps your bones and helps with mood. It also decreases numerous cancer risks so please take it as directed.  Low Vit D is  associated with a 200-300% higher risk for CANCER  and 200-300% higher risk for HEART   ATTACK  &  STROKE.   .....................................Marland Kitchen It is also associated with higher death rate at younger ages,  autoimmune diseases like Rheumatoid arthritis, Lupus, Multiple Sclerosis.    Also many other serious conditions, like depression, Alzheimer's Dementia, infertility, muscle aches, fatigue, fibromyalgia - just to name a few. ++++++++++++++++++++ Recommend the book "The END of DIETING" by Dr Monico Hoar  & the book "The END of DIABETES " by Dr Monico Hoar At [email or web address] - get book & Audio CD's    Being diabetic has a  300% increased risk for heart attack, stroke, cancer, and alzheimer- type vascular dementia. It is very important that you work harder with diet by avoiding all foods that are white. Avoid white rice (brown & wild rice is OK), white potatoes (sweetpotatoes in moderation is OK), White bread or wheat bread or anything made out of white flour like bagels, donuts, rolls, buns, biscuits, cakes, pastries, cookies, pizza crust, and pasta (made from white flour & egg whites) - vegetarian pasta or spinach or wheat pasta is OK. Multigrain breads like Arnold's or Pepperidge [employer], or multigrain sandwich thins  or flatbreads.  Diet, exercise and weight loss can reverse and cure diabetes in the early stages.  Diet, exercise and weight loss is very important in the control and prevention of complications of diabetes which affects every system in your body, ie. Brain - dementia/stroke, eyes - glaucoma/blindness, heart - heart attack/heart failure, kidneys - dialysis, stomach - gastric paralysis, intestines - malabsorption, nerves - severe painful neuritis, circulation - gangrene & loss of a leg(s), and finally cancer and Alzheimers.    I recommend avoid fried & greasy foods,  sweets/candy, white rice (brown or wild rice or Quinoa is OK), white potatoes (sweet potatoes are OK) - anything made from  white flour - bagels, doughnuts, rolls, buns, biscuits,white and wheat breads, pizza crust and traditional pasta made of white flour & egg white(vegetarian pasta or spinach or wheat pasta is OK).  Multi-grain bread is OK - like multi-grain flat bread or sandwich thins. Avoid alcohol in excess. Exercise is also important.    Eat all the vegetables you want - avoid meat, especially red meat and dairy - especially cheese.  Cheese is the most concentrated form of trans-fats which is the worst thing to clog up our arteries. Veggie cheese is OK which can be found in the fresh produce section at [employer] or [employer] or [employer]  +++++++++++++++++++++ DASH Eating Plan  DASH stands for "Dietary Approaches to Stop Hypertension."   The DASH eating plan is a healthy eating plan that has been shown to reduce high blood pressure (hypertension). Additional health benefits may include reducing the risk of type 2 diabetes mellitus, heart disease, and stroke. The DASH eating plan may also help with weight loss. WHAT DO I NEED TO KNOW ABOUT THE DASH EATING PLAN? For the DASH eating plan, you will follow these general guidelines:  Choose foods with a percent daily value for sodium of less than 5% (as listed on the food label).  Use salt-free seasonings or herbs instead of table salt or sea salt.  Check with your health care provider or pharmacist before using salt substitutes.  Eat lower-sodium products, often labeled as "lower sodium" or "no salt added."  Eat fresh foods.  Eat more vegetables, fruits, and low-fat dairy products.  Choose whole grains. Look for the word "whole" as the first word in the ingredient list.  Choose fish   Limit sweets, desserts, sugars, and sugary drinks.  Choose heart-healthy fats.  Eat veggie cheese   Eat more home-cooked food and less restaurant, buffet, and fast food.  Limit fried foods.  Cook foods using methods other than frying.  Limit canned  vegetables. If you do use them, rinse them well to decrease the sodium.  When eating at a restaurant, ask that your food be prepared with less salt, or no salt if possible.                      WHAT FOODS CAN I EAT? Read Dr Francis DowseJoel Fuhrman's books on The End of Dieting & The End of Diabetes  Grains Whole grain or whole wheat bread. Brown rice. Whole grain or whole wheat pasta. Quinoa, bulgur, and whole grain cereals. Low-sodium cereals. Corn or whole wheat flour tortillas. Whole grain cornbread. Whole grain crackers. Low-sodium crackers.  Vegetables Fresh or frozen vegetables (raw, steamed, roasted, or grilled). Low-sodium or reduced-sodium tomato and vegetable juices. Low-sodium or reduced-sodium tomato sauce and paste. Low-sodium or reduced-sodium canned vegetables.   Fruits All fresh, canned (in natural juice), or  frozen fruits.  Protein Products  All fish and seafood.  Dried beans, peas, or lentils. Unsalted nuts and seeds. Unsalted canned beans.  Dairy Low-fat dairy products, such as skim or 1% milk, 2% or reduced-fat cheeses, low-fat ricotta or cottage cheese, or plain low-fat yogurt. Low-sodium or reduced-sodium cheeses.  Fats and Oils Tub margarines without trans fats. Light or reduced-fat mayonnaise and salad dressings (reduced sodium). Avocado. Safflower, olive, or canola oils. Natural peanut or almond butter.  Other Unsalted popcorn and pretzels. The items listed above may not be a complete list of recommended foods or beverages. Contact your dietitian for more options.  +++++++++++++++  WHAT FOODS ARE NOT RECOMMENDED? Grains/ White flour or wheat flour White bread. White pasta. White rice. Refined cornbread. Bagels and croissants. Crackers that contain trans fat.  Vegetables  Creamed or fried vegetables. Vegetables in a . Regular canned vegetables. Regular canned tomato sauce and paste. Regular tomato and vegetable juices.  Fruits Dried fruits. Canned fruit in light  or heavy syrup. Fruit juice.  Meat and Other Protein Products Meat in general - RED meat & White meat.  Fatty cuts of meat. Ribs, chicken wings, all processed meats as bacon, sausage, bologna, salami, fatback, hot dogs, bratwurst and packaged luncheon meats.  Dairy Whole or 2% milk, cream, half-and-half, and cream cheese. Whole-fat or sweetened yogurt. Full-fat cheeses or blue cheese. Non-dairy creamers and whipped toppings. Processed cheese, cheese spreads, or cheese curds.  Condiments Onion and garlic salt, seasoned salt, table salt, and sea salt. Canned and packaged gravies. Worcestershire sauce. Tartar sauce. Barbecue sauce. Teriyaki sauce. Soy sauce, including reduced sodium. Steak sauce. Fish sauce. Oyster sauce. Cocktail sauce. Horseradish. Ketchup and mustard. Meat flavorings and tenderizers. Bouillon cubes. Hot sauce. Tabasco sauce. Marinades. Taco seasonings. Relishes.  Fats and Oils Butter, stick margarine, lard, shortening and bacon fat. Coconut, palm kernel, or palm oils. Regular salad dressings.  Pickles and olives. Salted popcorn and pretzels.  The items listed above may not be a complete list of foods and beverages to avoid.

## 2017-02-08 ENCOUNTER — Encounter: Payer: Self-pay | Admitting: Neurology

## 2017-02-08 ENCOUNTER — Telehealth: Payer: Self-pay | Admitting: *Deleted

## 2017-02-08 ENCOUNTER — Institutional Professional Consult (permissible substitution): Payer: Self-pay | Admitting: Neurology

## 2017-02-08 LAB — LIPID PANEL
Cholesterol: 191 mg/dL (ref <200)
HDL: 38 mg/dL — ABNORMAL LOW (ref >40)
LDL Cholesterol: 107 mg/dL — ABNORMAL HIGH (ref <100)
Total CHOL/HDL Ratio: 5 Ratio — ABNORMAL HIGH (ref <5.0)
Triglycerides: 232 mg/dL — ABNORMAL HIGH (ref <150)
VLDL: 46 mg/dL — AB (ref <30)

## 2017-02-08 LAB — HEPATIC FUNCTION PANEL
ALT: 35 U/L (ref 9–46)
AST: 22 U/L (ref 10–35)
Albumin: 4.5 g/dL (ref 3.6–5.1)
Alkaline Phosphatase: 64 U/L (ref 40–115)
BILIRUBIN INDIRECT: 0.4 mg/dL (ref 0.2–1.2)
Bilirubin, Direct: 0.1 mg/dL (ref <=0.2)
TOTAL PROTEIN: 6.6 g/dL (ref 6.1–8.1)
Total Bilirubin: 0.5 mg/dL (ref 0.2–1.2)

## 2017-02-08 LAB — BASIC METABOLIC PANEL WITH GFR
BUN: 20 mg/dL (ref 7–25)
CALCIUM: 9.9 mg/dL (ref 8.6–10.3)
CHLORIDE: 102 mmol/L (ref 98–110)
CO2: 24 mmol/L (ref 20–31)
CREATININE: 1.24 mg/dL (ref 0.70–1.33)
GFR, EST NON AFRICAN AMERICAN: 64 mL/min (ref >=60)
GFR, Est African American: 74 mL/min (ref >=60)
GLUCOSE: 84 mg/dL (ref 65–99)
Potassium: 4.3 mmol/L (ref 3.5–5.3)
Sodium: 139 mmol/L (ref 135–146)

## 2017-02-08 LAB — MAGNESIUM: Magnesium: 2 mg/dL (ref 1.5–2.5)

## 2017-02-08 LAB — TSH: TSH: 0.47 m[IU]/L (ref 0.40–4.50)

## 2017-02-08 LAB — HEMOGLOBIN A1C
HEMOGLOBIN A1C: 5.3 % (ref <5.7)
MEAN PLASMA GLUCOSE: 105 mg/dL

## 2017-02-08 LAB — TESTOSTERONE: Testosterone: 696 ng/dL (ref 250–827)

## 2017-02-08 LAB — INSULIN, RANDOM: Insulin: 20.4 u[IU]/mL — ABNORMAL HIGH (ref 2.0–19.6)

## 2017-02-08 LAB — VITAMIN D 25 HYDROXY (VIT D DEFICIENCY, FRACTURES): VIT D 25 HYDROXY: 46 ng/mL (ref 30–100)

## 2017-02-08 NOTE — Telephone Encounter (Signed)
No showed new patient appointment. 

## 2017-02-14 ENCOUNTER — Encounter (HOSPITAL_BASED_OUTPATIENT_CLINIC_OR_DEPARTMENT_OTHER): Payer: Self-pay | Admitting: *Deleted

## 2017-02-14 NOTE — Progress Notes (Signed)
NPO AFTER MN W/ EXCEPTION CLEAR LIQUIDS UNTIL 0830 (NO CREAM/ MILK PRODUCTS).  ARRIVE AT 1315.  CURRENT LAB RESULTS AND EKG IN CHART AND EPIC.

## 2017-02-27 ENCOUNTER — Ambulatory Visit (HOSPITAL_BASED_OUTPATIENT_CLINIC_OR_DEPARTMENT_OTHER)
Admission: RE | Admit: 2017-02-27 | Discharge: 2017-02-27 | Disposition: A | Discharge status: Home / Self Care | Payer: BLUE CROSS/BLUE SHIELD | Source: Ambulatory Visit | Attending: Urology | Admitting: Urology

## 2017-02-27 ENCOUNTER — Ambulatory Visit (HOSPITAL_BASED_OUTPATIENT_CLINIC_OR_DEPARTMENT_OTHER): Payer: BLUE CROSS/BLUE SHIELD | Admitting: Anesthesiology

## 2017-02-27 ENCOUNTER — Encounter (HOSPITAL_BASED_OUTPATIENT_CLINIC_OR_DEPARTMENT_OTHER): Payer: Self-pay

## 2017-02-27 ENCOUNTER — Encounter (HOSPITAL_BASED_OUTPATIENT_CLINIC_OR_DEPARTMENT_OTHER)
Admission: RE | Disposition: A | Discharge status: Home / Self Care | Payer: Self-pay | Source: Ambulatory Visit | Attending: Urology

## 2017-02-27 DIAGNOSIS — M1990 Unspecified osteoarthritis, unspecified site: Secondary | ICD-10-CM | POA: Diagnosis not present

## 2017-02-27 DIAGNOSIS — Z79899 Other long term (current) drug therapy: Secondary | ICD-10-CM | POA: Insufficient documentation

## 2017-02-27 DIAGNOSIS — A630 Anogenital (venereal) warts: Secondary | ICD-10-CM | POA: Insufficient documentation

## 2017-02-27 DIAGNOSIS — G4700 Insomnia, unspecified: Secondary | ICD-10-CM | POA: Insufficient documentation

## 2017-02-27 DIAGNOSIS — I10 Essential (primary) hypertension: Secondary | ICD-10-CM | POA: Diagnosis not present

## 2017-02-27 DIAGNOSIS — N182 Chronic kidney disease, stage 2 (mild): Secondary | ICD-10-CM | POA: Insufficient documentation

## 2017-02-27 DIAGNOSIS — E7800 Pure hypercholesterolemia, unspecified: Secondary | ICD-10-CM | POA: Diagnosis not present

## 2017-02-27 DIAGNOSIS — E782 Mixed hyperlipidemia: Secondary | ICD-10-CM | POA: Diagnosis not present

## 2017-02-27 DIAGNOSIS — I129 Hypertensive chronic kidney disease with stage 1 through stage 4 chronic kidney disease, or unspecified chronic kidney disease: Secondary | ICD-10-CM | POA: Insufficient documentation

## 2017-02-27 HISTORY — DX: Vitamin D deficiency, unspecified: E55.9

## 2017-02-27 HISTORY — DX: Mixed hyperlipidemia: E78.2

## 2017-02-27 HISTORY — PX: PENILE BIOPSY: SHX6013

## 2017-02-27 HISTORY — DX: Anogenital (venereal) warts: A63.0

## 2017-02-27 HISTORY — DX: Presence of spectacles and contact lenses: Z97.3

## 2017-02-27 HISTORY — DX: Testicular hypofunction: E29.1

## 2017-02-27 HISTORY — PX: CO2 LASER APPLICATION: SHX5778

## 2017-02-27 HISTORY — DX: Other intervertebral disc degeneration, lumbar region without mention of lumbar back pain or lower extremity pain: M51.369

## 2017-02-27 HISTORY — DX: Other intervertebral disc degeneration, lumbar region: M51.36

## 2017-02-27 SURGERY — BIOPSY, PENIS
Anesthesia: General | Site: Penis

## 2017-02-27 MED ORDER — KETOROLAC TROMETHAMINE 30 MG/ML IJ SOLN
INTRAMUSCULAR | Status: AC
  Filled 2017-02-27: qty 1

## 2017-02-27 MED ORDER — BACITRACIN-NEOMYCIN-POLYMYXIN 400-5-5000 EX OINT
TOPICAL_OINTMENT | CUTANEOUS | Status: AC
  Filled 2017-02-27: qty 1

## 2017-02-27 MED ORDER — CEFAZOLIN SODIUM-DEXTROSE 2-4 GM/100ML-% IV SOLN
2.0000 g | INTRAVENOUS | Status: AC
  Administered 2017-02-27: 2 g via INTRAVENOUS
  Filled 2017-02-27: qty 100

## 2017-02-27 MED ORDER — MIDAZOLAM HCL 5 MG/5ML IJ SOLN
INTRAMUSCULAR | Status: DC | PRN
  Administered 2017-02-27: 2 mg via INTRAVENOUS

## 2017-02-27 MED ORDER — MIDAZOLAM HCL 2 MG/2ML IJ SOLN
0.5000 mg | Freq: Once | INTRAMUSCULAR | Status: DC | PRN
  Filled 2017-02-27: qty 2

## 2017-02-27 MED ORDER — PROPOFOL 10 MG/ML IV BOLUS
INTRAVENOUS | Status: DC | PRN
  Administered 2017-02-27: 200 mg via INTRAVENOUS

## 2017-02-27 MED ORDER — PROMETHAZINE HCL 25 MG/ML IJ SOLN
6.2500 mg | INTRAMUSCULAR | Status: DC | PRN
  Filled 2017-02-27: qty 1

## 2017-02-27 MED ORDER — MIDAZOLAM HCL 2 MG/2ML IJ SOLN
INTRAMUSCULAR | Status: AC
  Filled 2017-02-27: qty 2

## 2017-02-27 MED ORDER — CEPHALEXIN 500 MG PO CAPS: 500.0000 mg | ORAL_CAPSULE | Freq: Three times a day (TID) | ORAL | 0 refills | Status: AC

## 2017-02-27 MED ORDER — BACITRACIN-NEOMYCIN-POLYMYXIN 400-5-5000 EX OINT
TOPICAL_OINTMENT | CUTANEOUS | Status: DC | PRN
  Administered 2017-02-27: 1 via TOPICAL

## 2017-02-27 MED ORDER — LIDOCAINE 2% (20 MG/ML) 5 ML SYRINGE
INTRAMUSCULAR | Status: DC | PRN
  Administered 2017-02-27: 100 mg via INTRAVENOUS

## 2017-02-27 MED ORDER — BUPIVACAINE HCL (PF) 0.5 % IJ SOLN
INTRAMUSCULAR | Status: AC
  Filled 2017-02-27: qty 30

## 2017-02-27 MED ORDER — CEFAZOLIN SODIUM-DEXTROSE 2-4 GM/100ML-% IV SOLN
INTRAVENOUS | Status: AC
  Filled 2017-02-27: qty 100

## 2017-02-27 MED ORDER — HYDROCODONE-ACETAMINOPHEN 5-325 MG PO TABS
ORAL_TABLET | ORAL | Status: AC
  Filled 2017-02-27: qty 1

## 2017-02-27 MED ORDER — FENTANYL CITRATE (PF) 100 MCG/2ML IJ SOLN
25.0000 ug | INTRAMUSCULAR | Status: DC | PRN
  Filled 2017-02-27: qty 1

## 2017-02-27 MED ORDER — LACTATED RINGERS IV SOLN
INTRAVENOUS | Status: DC
  Administered 2017-02-27: 14:00:00 via INTRAVENOUS
  Filled 2017-02-27: qty 1000

## 2017-02-27 MED ORDER — HYDROCODONE-ACETAMINOPHEN 5-325 MG PO TABS
1.0000 | ORAL_TABLET | ORAL | Status: DC | PRN
  Administered 2017-02-27: 1 via ORAL
  Filled 2017-02-27: qty 1

## 2017-02-27 MED ORDER — FENTANYL CITRATE (PF) 100 MCG/2ML IJ SOLN
INTRAMUSCULAR | Status: AC
  Filled 2017-02-27: qty 2

## 2017-02-27 MED ORDER — ONDANSETRON HCL 4 MG/2ML IJ SOLN
INTRAMUSCULAR | Status: AC
  Filled 2017-02-27: qty 2

## 2017-02-27 MED ORDER — KETOROLAC TROMETHAMINE 30 MG/ML IJ SOLN
INTRAMUSCULAR | Status: DC | PRN
  Administered 2017-02-27: 30 mg via INTRAVENOUS

## 2017-02-27 MED ORDER — DEXAMETHASONE SODIUM PHOSPHATE 10 MG/ML IJ SOLN
INTRAMUSCULAR | Status: AC
  Filled 2017-02-27: qty 1

## 2017-02-27 MED ORDER — HYDROCODONE-ACETAMINOPHEN 5-325 MG PO TABS: 1.0000 | ORAL_TABLET | ORAL | 0 refills | Status: DC | PRN

## 2017-02-27 MED ORDER — BISOPROLOL-HYDROCHLOROTHIAZIDE 5-6.25 MG PO TABS
1.0000 | ORAL_TABLET | Freq: Every day | ORAL | Status: DC
  Administered 2017-02-27: 1 via ORAL
  Filled 2017-02-27 (×2): qty 1

## 2017-02-27 MED ORDER — DEXAMETHASONE SODIUM PHOSPHATE 4 MG/ML IJ SOLN
INTRAMUSCULAR | Status: DC | PRN
  Administered 2017-02-27: 10 mg via INTRAVENOUS

## 2017-02-27 MED ORDER — ONDANSETRON HCL 4 MG/2ML IJ SOLN
INTRAMUSCULAR | Status: DC | PRN
  Administered 2017-02-27: 4 mg via INTRAVENOUS

## 2017-02-27 MED ORDER — FENTANYL CITRATE (PF) 100 MCG/2ML IJ SOLN
INTRAMUSCULAR | Status: DC | PRN
  Administered 2017-02-27 (×2): 50 ug via INTRAVENOUS

## 2017-02-27 MED ORDER — MEPERIDINE HCL 25 MG/ML IJ SOLN
6.2500 mg | INTRAMUSCULAR | Status: DC | PRN
  Filled 2017-02-27: qty 1

## 2017-02-27 SURGICAL SUPPLY — 37 items
ADH SKN CLS APL DERMABOND .7 (GAUZE/BANDAGES/DRESSINGS) ×2
BLADE HEX COATED 2.75 (ELECTRODE) ×3 IMPLANT
BLADE SURG 15 STRL LF DISP TIS (BLADE) ×2 IMPLANT
BLADE SURG 15 STRL SS (BLADE) ×3
BNDG CONFORM 2 STRL LF (GAUZE/BANDAGES/DRESSINGS) ×3 IMPLANT
COVER BACK TABLE 60X90IN (DRAPES) ×3 IMPLANT
COVER MAYO STAND STRL (DRAPES) ×3 IMPLANT
DECANTER SPIKE VIAL GLASS SM (MISCELLANEOUS) IMPLANT
DERMABOND ADVANCED (GAUZE/BANDAGES/DRESSINGS) ×1
DERMABOND ADVANCED .7 DNX12 (GAUZE/BANDAGES/DRESSINGS) IMPLANT
DRAPE LAPAROTOMY 100X72 PEDS (DRAPES) ×3 IMPLANT
ELECT REM PT RETURN 9FT ADLT (ELECTROSURGICAL) ×3
ELECTRODE REM PT RTRN 9FT ADLT (ELECTROSURGICAL) ×2 IMPLANT
GAUZE VASELINE 1X8 (GAUZE/BANDAGES/DRESSINGS) ×3 IMPLANT
GLOVE BIO SURGEON STRL SZ 6.5 (GLOVE) ×1 IMPLANT
GLOVE BIOGEL PI IND STRL 6.5 (GLOVE) IMPLANT
GLOVE BIOGEL PI IND STRL 7.5 (GLOVE) ×4 IMPLANT
GLOVE BIOGEL PI INDICATOR 6.5 (GLOVE) ×1
GLOVE BIOGEL PI INDICATOR 7.5 (GLOVE) ×3
GLOVE SURG SS PI 7.0 STRL IVOR (GLOVE) ×3 IMPLANT
GOWN STRL REUS W/TWL LRG LVL3 (GOWN DISPOSABLE) ×4 IMPLANT
GOWN STRL REUS W/TWL XL LVL3 (GOWN DISPOSABLE) ×2 IMPLANT
KIT RM TURNOVER CYSTO AR (KITS) ×3 IMPLANT
NDL HYPO 25X1 1.5 SAFETY (NEEDLE) IMPLANT
NEEDLE HYPO 25X1 1.5 SAFETY (NEEDLE) IMPLANT
NS IRRIG 500ML POUR BTL (IV SOLUTION) IMPLANT
PACK BASIN DAY SURGERY FS (CUSTOM PROCEDURE TRAY) ×3 IMPLANT
PENCIL BUTTON HOLSTER BLD 10FT (ELECTRODE) ×3 IMPLANT
SUT CHROMIC 3 0 SH 27 (SUTURE) ×4 IMPLANT
SUT MNCRL AB 4-0 PS2 18 (SUTURE) ×1 IMPLANT
SUT VIC AB 3-0 SH 27 (SUTURE) ×3
SUT VIC AB 3-0 SH 27X BRD (SUTURE) IMPLANT
SYR CONTROL 10ML LL (SYRINGE) IMPLANT
TOWEL OR 17X24 6PK STRL BLUE (TOWEL DISPOSABLE) ×6 IMPLANT
TRAY DSU PREP LF (CUSTOM PROCEDURE TRAY) ×3 IMPLANT
VACUUM HOSE 7/8X10 W/ WAND (MISCELLANEOUS) ×2 IMPLANT
WATER STERILE IRR 500ML POUR (IV SOLUTION) IMPLANT

## 2017-02-27 NOTE — H&P (Signed)
CC: I have penile warts.  HPI: Philip Montgomery is a 56 year-old male patient who was referred by Dr. Lucky CowboyWilliam McKeown, MD who is here for penile warts.  He is not having pain. His symptoms have gotten worse over the last year.   He has not had inflammation of the head of his penis. He has not had to use creams on the head of his penis. He has had the symptoms for 6 months. He has not undergone surgery for treatment. His treatment is not satisfactory to him. He was circumcised at birth.   He is not currently having trouble urinating. He is not having problems getting his urine stream started. He does not have a split stream when he urinates.   he patient has been evaluated for multiple sized and transmitted diseases, which are negative. Exam shows possible penile warts. I have advised the patient to have excisional biopsy of the abnormal penile area, and the suprapubic area for diagnostic purposes. Any other areas can be treated with creams as needed.     ALLERGIES: None   MEDICATIONS: Androgel 50 mg/5 gram (1 %) gel in packet  Crestor 20 mg tablet  Lisinopril 10 mg tablet  Cholecalciferol  Multiple Vitamin  Niacin 500 mg tablet  Omega 3-6-9  Valacyclovir 1,000 mg tablet     GU PSH: None     PSH Notes: microdiscectomy (2007)   NON-GU PSH: Hernia Repair    GU PMH: Chronic Kidney Disease Low back pain    NON-GU PMH: Hypercholesterolemia Hypertension Insomnia, unspecified    FAMILY HISTORY: Colon Cancer - Father Death of family member - Father Diabetes - Brother    Notes: 1 son; 1 daughter   SOCIAL HISTORY: Marital Status: Married Current Smoking Status: Patient has never smoked.   Tobacco Use Assessment Completed: Used Tobacco in last 30 days? Drinks 1 drink per week.  Drinks 3 caffeinated drinks per day. 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    REVIEW OF SYSTEMS:    GU Review Male:   Patient reports get up at night to urinate and erection problems. Patient denies  frequent urination, hard to postpone urination, burning/ pain with urination, leakage of urine, stream starts and stops, trouble starting your stream, have to strain to urinate , and penile pain.  Gastrointestinal (Upper):   Patient denies nausea, vomiting, and indigestion/ heartburn.  Gastrointestinal (Lower):   Patient denies diarrhea and constipation.  Constitutional:   Patient denies fever, night sweats, weight loss, and fatigue.  Skin:   Patient reports skin rash/ lesion. Patient denies itching.  Eyes:   Patient denies blurred vision and double vision.  Ears/ Nose/ Throat:   Patient denies sinus problems and sore throat.  Hematologic/Lymphatic:   Patient denies swollen glands and easy bruising.  Cardiovascular:   Patient denies leg swelling and chest pains.  Respiratory:   Patient denies cough and shortness of breath.  Endocrine:   Patient denies excessive thirst.  Musculoskeletal:   Patient denies back pain and joint pain.  Neurological:   Patient denies headaches and dizziness.  Psychologic:   Patient denies depression and anxiety.   VITAL SIGNS:      01/24/2017 10:34 AM  Weight 172 lb / 78.02 kg  Height 66 in / 167.64 cm  BP 144/80 mmHg  Pulse 82 /min  Temperature 98.2 F / 37 C  BMI 27.8 kg/m   GU PHYSICAL EXAMINATION:    Anus and Perineum: No hemorrhoids. No anal stenosis. No rectal fissure, no anal fissure. No  edema, no dimple, no perineal tenderness, no anal tenderness.  Scrotum: No lesions. No edema. No cysts. No warts.  Epididymides: Right: no spermatocele, no masses, no cysts, no tenderness, no induration, no enlargement. Left: no spermatocele, no masses, no cysts, no tenderness, no induration, no enlargement.  Testes: No tenderness, no swelling, no enlargement left testes. No tenderness, no swelling, no enlargement right testes. Normal location left testes. Normal location right testes. No mass, no cyst, no varicocele, no hydrocele left testes. No mass, no cyst, no  varicocele, no hydrocele right testes.  Urethral Meatus: Normal size. No lesion, no wart, no discharge, no polyp. Normal location.  Penis: Penile foreskin > 5 penile warts. Circumcised, no cracks. No dorsal peyronie's plaques, no left corporal peyronie's plaques, no right corporal peyronie's plaques, no scarring, no shaft warts. No balanitis, no meatal stenosis.   Prostate: 40 gram or 2+ size. Left lobe normal consistency, right lobe normal consistency. Symmetrical lobes. No prostate nodule. Left lobe no tenderness, right lobe no tenderness.  Seminal Vesicles: Nonpalpable.  Sphincter Tone: Normal sphincter. No rectal tenderness. No rectal mass.    MULTI-SYSTEM PHYSICAL EXAMINATION:    Constitutional: Well-nourished. No physical deformities. Normally developed. Good grooming.  Neck: Neck symmetrical, not swollen. Normal tracheal position.  Respiratory: No labored breathing, no use of accessory muscles.   Cardiovascular: Normal temperature, normal extremity pulses, no swelling, no varicosities.  Lymphatic: No enlargement of neck, axillae, groin.  Skin: No paleness, no jaundice, no cyanosis. No lesion, no ulcer, no rash.  Neurologic / Psychiatric: Oriented to time, oriented to place, oriented to person. No depression, no anxiety, no agitation.  Gastrointestinal: No mass, no tenderness, no rigidity, non obese abdomen.  Eyes: Normal conjunctivae. Normal eyelids.  Ears, Nose, Mouth, and Throat: Left ear no scars, no lesions, no masses. Right ear no scars, no lesions, no masses. Nose no scars, no lesions, no masses. Normal hearing. Normal lips.  Musculoskeletal: Normal gait and station of head and neck.     PAST DATA REVIEWED:  Source Of History:  Patient   PROCEDURES:          Urinalysis Dipstick Dipstick Cont'd  Color: Yellow Bilirubin: Neg  Appearance: Clear Ketones: Neg  Specific Gravity: 1.020 Blood: Neg  pH: 7.5 Protein: Neg  Glucose: Neg Urobilinogen: 0.2    Nitrites: Neg     Leukocyte Esterase: Neg    ASSESSMENT/PLAN:      ICD-10 Details  2 GU:   Genital warts - A63.0   1 NON-GU:   Viral wart, unspecified - B07.9           Notes:   He is in agreement that patient to have excisional biopsy for diagnostic purposes. We will arrange as an outpatient under anesthesia for best plastic surgery result.

## 2017-02-27 NOTE — Anesthesia Preprocedure Evaluation (Addendum)
Anesthesia Evaluation  Patient identified by MRN, date of birth, ID band Patient awake    Reviewed: Allergy & Precautions, NPO status , Patient's Chart, lab work & pertinent test results, reviewed documented beta blocker date and time   History of Anesthesia Complications Negative for: history of anesthetic complications  Airway Mallampati: II  TM Distance: >3 FB Neck ROM: Full    Dental  (+) Caps, Dental Advisory Given,    Pulmonary neg pulmonary ROS,    breath sounds clear to auscultation       Cardiovascular hypertension, Pt. on home beta blockers and Pt. on medications (-) angina Rhythm:Regular Rate:Normal     Neuro/Psych DDD    GI/Hepatic negative GI ROS, Neg liver ROS,   Endo/Other  negative endocrine ROS  Renal/GU negative Renal ROS     Musculoskeletal  (+) Arthritis , Osteoarthritis,    Abdominal   Peds  Hematology negative hematology ROS (+)   Anesthesia Other Findings   Reproductive/Obstetrics                           Anesthesia Physical Anesthesia Plan  ASA: II  Anesthesia Plan: General   Post-op Pain Management:    Induction: Intravenous  PONV Risk Score and Plan: 3 and Ondansetron, Dexamethasone and Midazolam  Airway Management Planned: LMA  Additional Equipment:   Intra-op Plan:   Post-operative Plan:   Informed Consent: I have reviewed the patients History and Physical, chart, labs and discussed the procedure including the risks, benefits and alternatives for the proposed anesthesia with the patient or authorized representative who has indicated his/her understanding and acceptance.   Dental advisory given  Plan Discussed with: CRNA and Surgeon  Anesthesia Plan Comments: (Plan routine monitors, GA- LMA OK)        Anesthesia Quick Evaluation

## 2017-02-27 NOTE — Transfer of Care (Signed)
Immediate Anesthesia Transfer of Care Note  Patient: Theressa StampsRene Herrington  Procedure(s) Performed: Procedure(s): SUPRAPUBIC  EXCISION OF  WARTS, CO2 LASER OF PENILE WARTS (N/A) CO2 LASER APPLICATION  Patient Location: PACU  Anesthesia Type:General  Level of Consciousness: awake, alert , oriented and patient cooperative  Airway & Oxygen Therapy: Patient Spontanous Breathing and Patient connected to nasal cannula oxygen  Post-op Assessment: Report given to RN and Post -op Vital signs reviewed and stable  Post vital signs: Reviewed and stable  Last Vitals:  Vitals:   02/27/17 1319  BP: 136/73  Pulse: 74  Resp: 16  Temp: 37.1 C    Last Pain:  Vitals:   02/27/17 1319  TempSrc: Oral      Patients Stated Pain Goal: 8 (02/27/17 1334)  Complications: No apparent anesthesia complications

## 2017-02-27 NOTE — Anesthesia Postprocedure Evaluation (Signed)
Anesthesia Post Note  Patient: Philip Montgomery  Procedure(s) Performed: Procedure(s) (LRB): SUPRAPUBIC  EXCISION OF  WARTS, CO2 LASER OF PENILE WARTS (N/A) CO2 LASER APPLICATION     Patient location during evaluation: PACU Anesthesia Type: General Level of consciousness: awake Pain management: pain level controlled Vital Signs Assessment: post-procedure vital signs reviewed and stable Respiratory status: spontaneous breathing Cardiovascular status: stable Anesthetic complications: no    Last Vitals:  Vitals:   02/27/17 1522 02/27/17 1530  BP: 117/83 126/88  Pulse: 73 77  Resp: 18 20  Temp: (!) 36.1 C     Last Pain:  Vitals:   02/27/17 1319  TempSrc: Oral                 Cythia Bachtel

## 2017-02-27 NOTE — Anesthesia Procedure Notes (Signed)
Procedure Name: LMA Insertion Date/Time: 02/27/2017 2:38 PM Performed by: Tyrone NineSAUVE, Denni France F Pre-anesthesia Checklist: Patient identified, Emergency Drugs available, Suction available, Patient being monitored and Timeout performed Patient Re-evaluated:Patient Re-evaluated prior to induction Oxygen Delivery Method: Circle system utilized Preoxygenation: Pre-oxygenation with 100% oxygen Induction Type: IV induction Ventilation: Mask ventilation without difficulty LMA: LMA inserted LMA Size: 5.0 Number of attempts: 1 Airway Equipment and Method: Bite block (gauze b/t front teeth) Placement Confirmation: positive ETCO2 and breath sounds checked- equal and bilateral Tube secured with: Tape Dental Injury: Teeth and Oropharynx as per pre-operative assessment

## 2017-02-27 NOTE — Discharge Instructions (Signed)

## 2017-02-27 NOTE — Op Note (Signed)
Date of procedure: 02/27/17  Preoperative diagnosis:  1. Penile and suprapubic warts   Postoperative diagnosis:  1. Same   Procedure: 1. Excision of suprapubic wart with primary closure 2. CO2 laser ablation of penile warts  Surgeon: Baruch Gouty, MD  Anesthesia: General  Complications: None  Intraoperative findings: The patient had 3 warts Each other in the suprapubic area just above the base of his penis that were excised primarily is one packet. Incision was approximately 2 cm in length. He had approximately 8 less than half millimeter size warts on his penile shaft that were ablated with the CO2 laser.  EBL: None  Specimens: Suprapubic warts to pathology  Drains: None  Disposition: Stable to the postanesthesia care unit  Indication for procedure: The patient is a 56 y.o. male with history of penile superpubic lesions that appear to be warts presents today for excisional biopsy and ablation of residual warts.  After reviewing the management options for treatment, the patient elected to proceed with the above surgical procedure(s). We have discussed the potential benefits and risks of the procedure, side effects of the proposed treatment, the likelihood of the patient achieving the goals of the procedure, and any potential problems that might occur during the procedure or recuperation. Informed consent has been obtained.  Description of procedure: The patient was met in the preoperative area. All risks, benefits, and indications of the procedure were described in great detail. The patient consented to the procedure. Preoperative antibiotics were given. The patient was taken to the operative theater. General anesthesia was induced per the anesthesia service. The patient was then placed in the supine position and prepped and draped in the usual sterile fashion. A preoperative timeout was called.   The patient had three less than half a centimeter warts near the midline just above  the base of his penis in the suprapubic region that were excised in elliptical fashion was approximately 2 cm in length. This area was sent to electrocautery until hemostasis was excellent. A 3-0 Vicryl subcutaneous suture was then placed to reapproximate the edges. Was then closed in a subcuticular fashion with a running 4-0 Monocryl. Dermabond was then placed on the incision. He had approximately 8 less than half centimeter lesions on the dorsal base of his penis. These were ablated with the CO2 laser. No further lesions were seen. The patient was awoken from anesthesia and transferred in stable condition to postanesthesia care unit.  Plan: The patient follow-up in one week to discuss pathology results. Pending pathology results, he may benefit from topical therapy if these are confirmed to be warts.  Baruch Gouty, M.D.

## 2017-02-28 ENCOUNTER — Encounter (HOSPITAL_BASED_OUTPATIENT_CLINIC_OR_DEPARTMENT_OTHER): Payer: Self-pay | Admitting: Urology

## 2017-03-06 DIAGNOSIS — A630 Anogenital (venereal) warts: Secondary | ICD-10-CM | POA: Diagnosis not present

## 2017-03-21 ENCOUNTER — Encounter: Payer: Self-pay | Admitting: Internal Medicine

## 2017-04-10 ENCOUNTER — Encounter: Payer: Self-pay | Admitting: Internal Medicine

## 2017-04-17 DIAGNOSIS — H31011 Macula scars of posterior pole (postinflammatory) (post-traumatic), right eye: Secondary | ICD-10-CM | POA: Diagnosis not present

## 2017-04-26 DIAGNOSIS — X32XXXD Exposure to sunlight, subsequent encounter: Secondary | ICD-10-CM | POA: Diagnosis not present

## 2017-04-26 DIAGNOSIS — L570 Actinic keratosis: Secondary | ICD-10-CM | POA: Diagnosis not present

## 2017-04-26 DIAGNOSIS — B078 Other viral warts: Secondary | ICD-10-CM | POA: Diagnosis not present

## 2017-05-23 ENCOUNTER — Ambulatory Visit: Payer: Self-pay | Admitting: Physician Assistant

## 2017-06-09 ENCOUNTER — Other Ambulatory Visit: Payer: Self-pay | Admitting: Internal Medicine

## 2017-06-12 ENCOUNTER — Ambulatory Visit: Payer: BLUE CROSS/BLUE SHIELD | Admitting: Physician Assistant

## 2017-06-12 ENCOUNTER — Encounter: Payer: Self-pay | Admitting: Physician Assistant

## 2017-06-12 VITALS — BP 106/76 | HR 63 | Temp 96.3°F | Ht 65.5 in | Wt 166.6 lb

## 2017-06-12 DIAGNOSIS — E291 Testicular hypofunction: Secondary | ICD-10-CM | POA: Diagnosis not present

## 2017-06-12 DIAGNOSIS — G8929 Other chronic pain: Secondary | ICD-10-CM

## 2017-06-12 DIAGNOSIS — I10 Essential (primary) hypertension: Secondary | ICD-10-CM | POA: Diagnosis not present

## 2017-06-12 DIAGNOSIS — M545 Low back pain, unspecified: Secondary | ICD-10-CM

## 2017-06-12 NOTE — Progress Notes (Signed)
Subjective:    Patient ID: Philip StampsRene Montgomery, male    DOB: 08/08/1960, 56 y.o.   MRN: [id number]  HPI 56 y.o. WM presents for BP evaluation. 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 States at home BP has been good and here it is good today. He has been nervous.   Blood pressure 106/76, pulse 63, temperature (!) 96.3 F (35.7 C), height 5' 5.5" (1.664 m), weight 166 lb 9.6 oz (75.6 kg), SpO2 99 %.  Medications Current Outpatient Medications on File Prior to Visit  Medication Sig  . ANDROGEL PUMP 20.25 MG/ACT (1.62%) GEL APPLY 2 PUMPS TO THE SKIN QD  . bisoprolol-hydrochlorothiazide (ZIAC) 5-6.25 MG tablet Take 1 tablet daily for BP (Patient taking differently: Take 1 tablet by mouth every evening. Take 1 tablet daily for BP)  . Cholecalciferol (VITAMIN D3) 5000 units CAPS Take 2 capsules by mouth daily.  Marland Kitchen. dexamethasone (DECADRON) 0.5 MG/5ML solution Take 0.5 mg by mouth 4 (four) times daily as needed. Oral rinse for kanker sores as needed  . HYDROcodone-acetaminophen (NORCO) 5-325 MG tablet Take 1 tablet by mouth every 4 (four) hours as needed for moderate pain.  Marland Kitchen. lisinopril (PRINIVIL,ZESTRIL) 10 MG tablet TAKE 1 TABLET BY MOUTH DAILY  . Multiple Vitamins-Minerals (MULTIVITAMIN PO) Take by mouth.  . Multiple Vitamins-Minerals (PRESERVISION AREDS 2) CAPS Take 1 capsule by mouth 2 (two) times daily.  . Omega 3-6-9 Fatty Acids (OMEGA 3-6-9 COMPLEX PO) Take by mouth every evening.   . rosuvastatin (CRESTOR) 20 MG tablet TAKE 1 TABLET BY MOUTH DAILY  . Zinc 50 MG TABS Take 1 tablet by mouth every evening.   No current facility-administered medications on file prior to visit.     Problem list He has Hyperlipidemia, mixed; Hypertension; Chronic kidney disease; Insomnia; HSV-1 (herpes simplex virus 1) infection; Testosterone deficiency; DDD (degenerative disc disease), lumbar; Insulin resistance; Vitamin D deficiency; Other abnormal glucose;  and Medication management on their problem list.   Review of Systems  Constitutional: Negative.   HENT: Negative.   Respiratory: Negative.   Cardiovascular: Negative.   Gastrointestinal: Negative.   Genitourinary: Negative.   Musculoskeletal: Negative.   Skin: Negative.   Neurological: Negative.   Psychiatric/Behavioral: Negative.        Objective:   Physical Exam  Constitutional: He is oriented to person, place, and time. He appears well-developed and well-nourished.  HENT:  Head: Normocephalic and atraumatic.  Right Ear: External ear normal.  Left Ear: External ear normal.  Mouth/Throat: Oropharynx is clear and moist.  Eyes: Conjunctivae and EOM are normal. Pupils are equal, round, and reactive to light.  Neck: Normal range of motion. Neck supple.  Cardiovascular: Normal rate, regular rhythm and normal heart sounds.  Pulmonary/Chest: Effort normal and breath sounds normal.  Abdominal: Soft. Bowel sounds are normal.  Musculoskeletal: Normal range of motion.  Raising slowly due to back pain  Neurological: He is alert and oriented to person, place, and time. No cranial nerve deficit.  Skin: Skin is warm and dry.  Psychiatric: He has a normal mood and affect. His behavior is normal.        Assessment & Plan:    Essential hypertension - continue medications, DASH diet, exercise and monitor at home. Call if greater than 130/80.   Hypogonadism in male Continue zinc, can also add whey protein, can try to taper off depending on symptoms  Chronic midline low back pain without sciatica Exercises,  heat, add tumeric    Future Appointments  Date Time Provider Department Center  08/02/2017  4:00 PM Quentin Mullingollier, Kerrianne Jeng, PA-C [id number] None

## 2017-06-12 NOTE — Patient Instructions (Addendum)
Tumeric with black pepper extract is a great natural antiinflammatory that helps with arthritis and aches and pain. Can get from [employer] or any health food store. Need to take at least 800mg  twice a day with food.    9 Ways to Naturally Increase Testosterone Levels  1.   Lose Weight If you're overweight, shedding the excess pounds may increase your testosterone levels, according to research presented at the Endocrine Society's 2012 meeting. Overweight men are more likely to have low testosterone levels to begin with, so this is an important trick to increase your body's testosterone production when you need it most.  2.   High-Intensity Exercise like Peak Fitness  Short intense exercise has a proven positive effect on increasing testosterone levels and preventing its decline. That's unlike aerobics or prolonged moderate exercise, which have shown to have negative or no effect on testosterone levels. Having a whey protein meal after exercise can further enhance the satiety/testosterone-boosting impact (hunger hormones cause the opposite effect on your testosterone and libido). Here's a summary of what a typical high-intensity Peak Fitness routine might look like: " Warm up for three minutes  " Exercise as hard and fast as you can for 30 seconds. You should feel like you couldn't possibly go on another few seconds  " Recover at a slow to moderate pace for 90 seconds  " Repeat the high intensity exercise and recovery 7 more times .  3.   Consume Plenty of Zinc The mineral zinc is important for testosterone production, and supplementing your diet for as little as six weeks has been shown to cause a marked improvement in testosterone among men with low levels.1 Likewise, research has shown that restricting dietary sources of zinc leads to a significant decrease in testosterone, while zinc supplementation increases it2 -- and even protects men from exercised-induced reductions in testosterone  levels.3 It's estimated that up to 45 percent of adults over the age of 60 may have lower than recommended zinc intakes; even when dietary supplements were added in, an estimated 20-25 percent of older adults still had inadequate zinc intakes, according to a [employer] and Nutrition Examination Survey.4 Your diet is the best source of zinc; along with protein-rich foods like meats and fish, other good dietary sources of zinc include raw milk, raw cheese, beans, and yogurt or kefir made from raw milk. It can be difficult to obtain enough dietary zinc if you're a vegetarian, and also for meat-eaters as well, largely because of conventional farming methods that rely heavily on chemical fertilizers and pesticides. These chemicals deplete the soil of nutrients ... nutrients like zinc that must be absorbed by plants in order to be passed on to you. In many cases, you may further deplete the nutrients in your food by the way you prepare it. For most food, cooking it will drastically reduce its levels of nutrients like zinc . particularly over-cooking, which many people do. If you decide to use a zinc supplement, stick to a dosage of less than 40 mg a day, as this is the recommended adult upper limit. Taking too much zinc can interfere with your body's ability to absorb other minerals, especially copper, and may cause nausea as a side effect.  4.   Strength Training In addition to Peak Fitness, strength training is also known to boost testosterone levels, provided you are doing so intensely enough. When strength training to boost testosterone, you'll want to increase the weight and lower your number of reps, and then  focus on exercises that work a large number of muscles, such as dead lifts or squats.  You can "turbo-charge" your weight training by going slower. By slowing down your movement, you're actually turning it into a high-intensity exercise. Super Slow movement allows your muscle, at the microscopic  level, to access the maximum number of cross-bridges between the protein filaments that produce movement in the muscle.   5.   Optimize Your Vitamin D Levels Vitamin D, a steroid hormone, is essential for the healthy development of the nucleus of the sperm cell, and helps maintain semen quality and sperm count. Vitamin D also increases levels of testosterone, which may boost libido. In one study, overweight men who were given vitamin D supplements had a significant increase in testosterone levels after one year.5   6.   Reduce Stress When you're under a lot of stress, your body releases high levels of the stress hormone cortisol. This hormone actually blocks the effects of testosterone,6 presumably because, from a biological standpoint, testosterone-associated behaviors (mating, competing, aggression) may have lowered your chances of survival in an emergency (hence, the "fight or flight" response is dominant, courtesy of cortisol).  7.   Limit or Eliminate Sugar from Your Diet Testosterone levels decrease after you eat sugar, which is likely because the sugar leads to a high insulin level, another factor leading to low testosterone.7 Based on USDA estimates, the average American consumes 12 teaspoons of sugar a day, which equates to about TWO TONS of sugar during a lifetime.  8.   Eat Healthy Fats By healthy, this means not only mon- and polyunsaturated fats, like that found in avocadoes and nuts, but also saturated, as these are essential for building testosterone. Research shows that a diet with less than 40 percent of energy as fat (and that mainly from animal sources, i.e. saturated) lead to a decrease in testosterone levels.8 My personal diet is about 60-70 percent healthy fat, and other experts agree that the ideal diet includes somewhere between 50-70 percent fat.  It's important to understand that your body requires saturated fats from animal and vegetable sources (such as meat, dairy, certain  oils, and tropical plants like coconut) for optimal functioning, and if you neglect this important food group in favor of sugar, grains and other starchy carbs, your health and weight are almost guaranteed to suffer. Examples of healthy fats you can eat more of to give your testosterone levels a boost include: Olives and Olive oil  Coconuts and coconut oil Butter made from raw grass-fed organic milk Raw nuts, such as, almonds or pecans Organic pastured egg yolks Avocados Grass-fed meats Palm oil Unheated organic nut oils   9.   Boost Your Intake of Branch Chain Amino Acids (BCAA) from Foods Like Whey Protein Research suggests that BCAAs result in higher testosterone levels, particularly when taken along with resistance training.9 While BCAAs are available in supplement form, you'll find the highest concentrations of BCAAs like leucine in dairy products - especially quality cheeses and whey protein. Even when getting leucine from your natural food supply, it's often wasted or used as a building block instead of an anabolic agent. So to create the correct anabolic environment, you need to boost leucine consumption way beyond mere maintenance levels. That said, keep in mind that using leucine as a free form amino acid can be highly counterproductive as when free form amino acids are artificially administrated, they rapidly enter your circulation while disrupting insulin function, and impairing your body's glycemic control.  Food-based leucine is really the ideal form that can benefit your muscles without side effects.   If want to taper off testosterone try going from 4 to 2 pumps for 2 weeks, then 1 pump a day for 2 weeks, if symptoms can stay on it.    Try the exercises and other information.  You can take tylenol (500mg ) or tylenol arthritis (650mg ) with the meloxicam/antiinflammatories. The max you can take of tylenol a day is 3000mg  daily, this is a max of 6 pills a day of the regular tyelnol (500mg )  or a max of 4 a day of the tylenol arthritis (650mg ) as long as no other medications you are taking contain tylenol.    If you are not better in 1-3 month we will refer you to ortho   Back pain Rehab Ask your health care provider which exercises are safe for you. Do exercises exactly as told by your health care provider and adjust them as directed. It is normal to feel mild stretching, pulling, tightness, or discomfort as you do these exercises, but you should stop right away if you feel sudden pain or your pain gets worse.Do not begin these exercises until told by your health care provider. Stretching and range of motion exercises These exercises warm up your muscles and joints and improve the movement and flexibility of your hips and your back. These exercises also help to relieve pain, numbness, and tingling. Exercise A: Sciatic nerve glide 1. Sit in a chair with your head facing down toward your chest. Place your hands behind your back. Let your shoulders slump forward. 2. Slowly straighten one of your knees while you tilt your head back as if you are looking toward the ceiling. Only straighten your leg as far as you can without making your symptoms worse. 3. Hold for __________ seconds. 4. Slowly return to the starting position. 5. Repeat with your other leg. Repeat __________ times. Complete this exercise __________ times a day. Exercise B: Knee to chest with hip adduction and internal rotation  1. Lie on your back on a firm surface with both legs straight. 2. Bend one of your knees and move it up toward your chest until you feel a gentle stretch in your lower back and buttock. Then, move your knee toward the shoulder that is on the opposite side from your leg. ? Hold your leg in this position by holding onto the front of your knee. 3. Hold for __________ seconds. 4. Slowly return to the starting position. 5. Repeat with your other leg. Repeat __________ times. Complete this exercise  __________ times a day. Exercise C: Prone extension on elbows  1. Lie on your abdomen on a firm surface. A bed may be too soft for this exercise. 2. Prop yourself up on your elbows. 3. Use your arms to help lift your chest up until you feel a gentle stretch in your abdomen and your lower back. ? This will place some of your body weight on your elbows. If this is uncomfortable, try stacking pillows under your chest. ? Your hips should stay down, against the surface that you are lying on. Keep your hip and back muscles relaxed. 4. Hold for __________ seconds. 5. Slowly relax your upper body and return to the starting position. Repeat __________ times. Complete this exercise __________ times a day. Strengthening exercises These exercises build strength and endurance in your back. Endurance is the ability to use your muscles for a long time, even after they get  tired. Exercise D: Pelvic tilt 1. Lie on your back on a firm surface. Bend your knees and keep your feet flat. 2. Tense your abdominal muscles. Tip your pelvis up toward the ceiling and flatten your lower back into the floor. ? To help with this exercise, you may place a small towel under your lower back and try to push your back into the towel. 3. Hold for __________ seconds. 4. Let your muscles relax completely before you repeat this exercise. Repeat __________ times. Complete this exercise __________ times a day. Exercise E: Alternating arm and leg raises  1. Get on your hands and knees on a firm surface. If you are on a hard floor, you may want to use padding to cushion your knees, such as an exercise mat. 2. Line up your arms and legs. Your hands should be below your shoulders, and your knees should be below your hips. 3. Lift your left leg behind you. At the same time, raise your right arm and straighten it in front of you. ? Do not lift your leg higher than your hip. ? Do not lift your arm higher than your shoulder. ? Keep your  abdominal and back muscles tight. ? Keep your hips facing the ground. ? Do not arch your back. ? Keep your balance carefully, and do not hold your breath. 4. Hold for __________ seconds. 5. Slowly return to the starting position and repeat with your right leg and your left arm. Repeat __________ times. Complete this exercise __________ times a day. Posture and body mechanics  Body mechanics refers to the movements and positions of your body while you do your daily activities. Posture is part of body mechanics. Good posture and healthy body mechanics can help to relieve stress in your body's tissues and joints. Good posture means that your spine is in its natural S-curve position (your spine is neutral), your shoulders are pulled back slightly, and your head is not tipped forward. The following are general guidelines for applying improved posture and body mechanics to your everyday activities. Standing   When standing, keep your spine neutral and your feet about hip-width apart. Keep a slight bend in your knees. Your ears, shoulders, and hips should line up.  When you do a task in which you stand in one place for a long time, place one foot up on a stable object that is 2-4 inches (5-10 cm) high, such as a footstool. This helps keep your spine neutral. Sitting   When sitting, keep your spine neutral and keep your feet flat on the floor. Use a footrest, if necessary, and keep your thighs parallel to the floor. Avoid rounding your shoulders, and avoid tilting your head forward.  When working at a desk or a computer, keep your desk at a height where your hands are slightly lower than your elbows. Slide your chair under your desk so you are close enough to maintain good posture.  When working at a computer, place your monitor at a height where you are looking straight ahead and you do not have to tilt your head forward or downward to look at the screen. Resting   When lying down and resting,  avoid positions that are most painful for you.  If you have pain with activities such as sitting, bending, stooping, or squatting (flexion-based activities), lie in a position in which your body does not bend very much. For example, avoid curling up on your side with your arms and knees near your  chest (fetal position).  If you have pain with activities such as standing for a long time or reaching with your arms (extension-based activities), lie with your spine in a neutral position and bend your knees slightly. Try the following positions: ? Lying on your side with a pillow between your knees. ? Lying on your back with a pillow under your knees. Lifting   When lifting objects, keep your feet at least shoulder-width apart and tighten your abdominal muscles.  Bend your knees and hips and keep your spine neutral. It is important to lift using the strength of your legs, not your back. Do not lock your knees straight out.  Always ask for help to lift heavy or awkward objects. This information is not intended to replace advice given to you by your health care provider. Make sure you discuss any questions you have with your health care provider. Document Released: 07/18/2005 Document Revised: 03/24/2016 Document Reviewed: 04/03/2015 [employer] Interactive Patient Education  2018 [employer]  Monitor your blood pressure at home. Go to the ER if any CP, SOB, nausea, dizziness, severe HA, changes vision/speech  Goal BP:  For patients younger than 60: Goal BP < 140/90. For patients 60 and older: Goal BP < 150/90. For patients with diabetes: Goal BP < 140/90. Your most recent BP: BP: 106/76   Take your medications faithfully as instructed. Maintain a healthy weight. Get at least 150 minutes of aerobic exercise per week. Minimize salt intake. Minimize alcohol intake  DASH Eating Plan DASH stands for "Dietary Approaches to Stop Hypertension." The DASH eating plan is a healthy eating plan that  has been shown to reduce high blood pressure (hypertension). Additional health benefits may include reducing the risk of type 2 diabetes mellitus, heart disease, and stroke. The DASH eating plan may also help with weight loss. WHAT DO I NEED TO KNOW ABOUT THE DASH EATING PLAN? For the DASH eating plan, you will follow these general guidelines:  Choose foods with a percent daily value for sodium of less than 5% (as listed on the food label).  Use salt-free seasonings or herbs instead of table salt or sea salt.  Check with your health care provider or pharmacist before using salt substitutes.  Eat lower-sodium products, often labeled as "lower sodium" or "no salt added."  Eat fresh foods.  Eat more vegetables, fruits, and low-fat dairy products.  Choose whole grains. Look for the word "whole" as the first word in the ingredient list.  Choose fish and skinless chicken or [place] more often than red meat. Limit fish, poultry, and meat to 6 oz (170 g) each day.  Limit sweets, desserts, sugars, and sugary drinks.  Choose heart-healthy fats.  Limit cheese to 1 oz (28 g) per day.  Eat more home-cooked food and less restaurant, buffet, and fast food.  Limit fried foods.  Cook foods using methods other than frying.  Limit canned vegetables. If you do use them, rinse them well to decrease the sodium.  When eating at a restaurant, ask that your food be prepared with less salt, or no salt if possible. WHAT FOODS CAN I EAT? Seek help from a dietitian for individual calorie needs. Grains Whole grain or whole wheat bread. Brown rice. Whole grain or whole wheat pasta. Quinoa, bulgur, and whole grain cereals. Low-sodium cereals. Corn or whole wheat flour tortillas. Whole grain cornbread. Whole grain crackers. Low-sodium crackers. Vegetables Fresh or frozen vegetables (raw, steamed, roasted, or grilled). Low-sodium or reduced-sodium tomato and vegetable juices.  Low-sodium or reduced-sodium tomato  sauce and paste. Low-sodium or reduced-sodium canned vegetables.  Fruits All fresh, canned (in natural juice), or frozen fruits. Meat and Other Protein Products Ground beef (85% or leaner), grass-fed beef, or beef trimmed of fat. Skinless chicken or [place]key. Ground chicken or [place]key. Pork trimmed of fat. All fish and seafood. Eggs. Dried beans, peas, or lentils. Unsalted nuts and seeds. Unsalted canned beans. Dairy Low-fat dairy products, such as skim or 1% milk, 2% or reduced-fat cheeses, low-fat ricotta or cottage cheese, or plain low-fat yogurt. Low-sodium or reduced-sodium cheeses. Fats and Oils Tub margarines without trans fats. Light or reduced-fat mayonnaise and salad dressings (reduced sodium). Avocado. Safflower, olive, or canola oils. Natural peanut or almond butter. Other Unsalted popcorn and pretzels. The items listed above may not be a complete list of recommended foods or beverages. Contact your dietitian for more options. WHAT FOODS ARE NOT RECOMMENDED? Grains White bread. White pasta. White rice. Refined cornbread. Bagels and croissants. Crackers that contain trans fat. Vegetables Creamed or fried vegetables. Vegetables in a cheese sauce. Regular canned vegetables. Regular canned tomato sauce and paste. Regular tomato and vegetable juices. Fruits Dried fruits. Canned fruit in light or heavy syrup. Fruit juice. Meat and Other Protein Products Fatty cuts of meat. Ribs, chicken wings, bacon, sausage, bologna, salami, chitterlings, fatback, hot dogs, bratwurst, and packaged luncheon meats. Salted nuts and seeds. Canned beans with salt. Dairy Whole or 2% milk, cream, half-and-half, and cream cheese. Whole-fat or sweetened yogurt. Full-fat cheeses or blue cheese. Nondairy creamers and whipped toppings. Processed cheese, cheese spreads, or cheese curds. Condiments Onion and garlic salt, seasoned salt, table salt, and sea salt. Canned and packaged gravies. Worcestershire sauce. Tartar  sauce. Barbecue sauce. Teriyaki sauce. Soy sauce, including reduced sodium. Steak sauce. Fish sauce. Oyster sauce. Cocktail sauce. Horseradish. Ketchup and mustard. Meat flavorings and tenderizers. Bouillon cubes. Hot sauce. Tabasco sauce. Marinades. Taco seasonings. Relishes. Fats and Oils Butter, stick margarine, lard, shortening, ghee, and bacon fat. Coconut, palm kernel, or palm oils. Regular salad dressings. Other Pickles and olives. Salted popcorn and pretzels. The items listed above may not be a complete list of foods and beverages to avoid. Contact your dietitian for more information. WHERE CAN I FIND MORE INFORMATION? National Heart, Lung, and Blood Institute: [employer].itwww.nhlbi.nih.gov/health/health-topics/topics/dash/ Document Released: 07/07/2011 Document Revised: 12/02/2013 Document Reviewed: 05/22/2013 [facility] Patient Information 2015 [employer], [place]. This information is not intended to replace advice given to you by your health care provider. Make sure you discuss any questions you have with your health care provider.

## 2017-06-21 ENCOUNTER — Encounter: Payer: Self-pay | Admitting: Adult Health

## 2017-06-21 ENCOUNTER — Ambulatory Visit: Payer: BLUE CROSS/BLUE SHIELD | Admitting: Adult Health

## 2017-06-21 VITALS — BP 118/74 | HR 60 | Temp 97.4°F | Resp 18 | Ht 65.5 in | Wt 166.8 lb

## 2017-06-21 DIAGNOSIS — M545 Low back pain, unspecified: Secondary | ICD-10-CM

## 2017-06-21 MED ORDER — PREDNISONE 20 MG PO TABS: ORAL_TABLET | ORAL | 0 refills | Status: DC

## 2017-06-21 MED ORDER — CYCLOBENZAPRINE HCL 5 MG PO TABS: 5.0000 mg | ORAL_TABLET | Freq: Three times a day (TID) | ORAL | 0 refills | Status: DC | PRN

## 2017-06-21 NOTE — Progress Notes (Signed)
Assessment and Plan:   Lower back pain Severe pain without radiating neurologic symptoms  Prednisone was prescribed,NSAIDs, RICE, and exercise given Consider Physical Therapy and XRay studies if not improving.  Call or return to clinic prn if these symptoms worsen or fail to improve as anticipated- patient will send message if pain not improving by Sunday, will make new referral to neuro surgery.   Further disposition pending results of labs. Discussed med's effects and SE's.   Over 15 minutes of exam, counseling, chart review, and critical decision making was performed.   Future Appointments  Date Time Provider Department Center  08/02/2017  4:00 PM Quentin Mullingollier, Amanda, PA-C [id number] None    ------------------------------------------------------------------------------------------------------------------   HPI BP 118/74   Pulse 60   Temp (!) 97.4 F (36.3 C)   Resp 18   Ht 5' 5.5" (1.664 m)   Wt 166 lb 12.8 oz (75.7 kg)   BMI 27.33 kg/m   Philip Montgomery is a 56 y.o. male who complains of an injury causing low back pain 9 day(s) ago. The pain is worse on the L lower back - constant achy/intermittent sharp pain - 10/10 - positional with bending/lifting/walking, without radiation down the legs. Mechanism of injury: no specific acute event - moved last week and was moving some boxes around, have been sleeping on a different bed. Symptoms have been worsening since that time. Prior history of back problems: recurrent self limited episodes of low back pain in the past and previous spinal surgery - micro discectomy in 2006 - for L4-5 narrowing. There is not numbness in the legs. Patient denies fever, hematuria, incontinence, numbness, tingling, weakness and saddle anesthesia.  He performs regular stretching for his ongoing intermittent back issues -  Cannot recall who performed his back surgery - reports office no longer active.   He has taken some old flexeril and norco for pain - has 12 tabs  left. He has tried applying heat in the past but not helping significantly this time. Has tried aleve with minimal relief.   Past Medical History:  Diagnosis Date  . DDD (degenerative disc disease), lumbar   . Hyperlipidemia, mixed   . Hypertension   . Hypogonadism male   . Penile wart   . Vitamin D deficiency   . Wears contact lenses      No Known Allergies  Current Outpatient Medications on File Prior to Visit  Medication Sig  . ANDROGEL PUMP 20.25 MG/ACT (1.62%) GEL APPLY 2 PUMPS TO THE SKIN QD  . bisoprolol-hydrochlorothiazide (ZIAC) 5-6.25 MG tablet Take 1 tablet daily for BP (Patient taking differently: Take 1 tablet by mouth every evening. Take 1 tablet daily for BP)  . Cholecalciferol (VITAMIN D3) 5000 units CAPS Take 2 capsules by mouth daily.  Marland Kitchen. dexamethasone (DECADRON) 0.5 MG/5ML solution Take 0.5 mg by mouth 4 (four) times daily as needed. Oral rinse for kanker sores as needed  . HYDROcodone-acetaminophen (NORCO) 5-325 MG tablet Take 1 tablet by mouth every 4 (four) hours as needed for moderate pain.  Marland Kitchen. lisinopril (PRINIVIL,ZESTRIL) 10 MG tablet TAKE 1 TABLET BY MOUTH DAILY  . Multiple Vitamins-Minerals (MULTIVITAMIN PO) Take by mouth.  . Multiple Vitamins-Minerals (PRESERVISION AREDS 2) CAPS Take 1 capsule by mouth 2 (two) times daily.  . Omega 3-6-9 Fatty Acids (OMEGA 3-6-9 COMPLEX PO) Take by mouth every evening.   . rosuvastatin (CRESTOR) 20 MG tablet TAKE 1 TABLET BY MOUTH DAILY  . Zinc 50 MG TABS Take 1 tablet by mouth every evening.  No current facility-administered medications on file prior to visit.     ROS: all negative except above.   Physical Exam:  BP 118/74   Pulse 60   Temp (!) 97.4 F (36.3 C)   Resp 18   Ht 5' 5.5" (1.664 m)   Wt 166 lb 12.8 oz (75.7 kg)   BMI 27.33 kg/m   General Appearance: Well nourished, in no apparent distress. Neck: Supple.  Respiratory: Respiratory effort normal, BS equal bilaterally without rales, rhonchi, wheezing  or stridor.  Cardio: RRR with no MRGs. Brisk peripheral pulses without edema.  Abdomen: Soft, + BS.  Non tender, no guarding.Marland Kitchen. Lymphatics: Non tender without lymphadenopathy.  Musculoskeletal: 5/5 strength-symmetrical , slow antalgic gait. Pain with ambulation and attempts to sit - no spinal tenderness over midline, no SI joint tenderness or bony abnormalities, tender to palpation over paraspinals on L and latissimus dorsi. Endorses pain in back with attempts at straight leg raises, worse on L - he reports he has ongoing pain with this at baseline.  Skin: Warm, dry without rashes, lesions, ecchymosis.  Neuro: Cranial nerves intact. Normal muscle tone, no cerebellar symptoms. Sensation intact.  Psych: Awake and oriented X 3, normal affect, Insight and Judgment appropriate.     Dan MakerAshley C Andreanna Mikolajczak, NP 10:41 AM [facility]

## 2017-06-21 NOTE — Patient Instructions (Signed)
Go to the ER if you have any new weakness in your legs, have trouble controlling your urine or bowels, or have worsening pain.    Try the exercises and other information in the back care manual, meloxicam once during the day as needed (avoid taking other NSAIDS like Alleve or Ibuprofen while taking this) and then flexeril if needed at bedtime for muscle spasm. This can be taken up to every 8 hours, but causes sedation, so should not drive or operate heavy machinery while taking this medicine.   Go to the ER if you have any new weakness in your legs, have trouble controlling your urine or bowels, or have worsening pain.   If you are not better in 1-3 month we will refer you to ortho   Back pain Rehab Ask your health care provider which exercises are safe for you. Do exercises exactly as told by your health care provider and adjust them as directed. It is normal to feel mild stretching, pulling, tightness, or discomfort as you do these exercises, but you should stop right away if you feel sudden pain or your pain gets worse.Do not begin these exercises until told by your health care provider. Stretching and range of motion exercises These exercises warm up your muscles and joints and improve the movement and flexibility of your hips and your back. These exercises also help to relieve pain, numbness, and tingling. Exercise A: Sciatic nerve glide 1. Sit in a chair with your head facing down toward your chest. Place your hands behind your back. Let your shoulders slump forward. 2. Slowly straighten one of your knees while you tilt your head back as if you are looking toward the ceiling. Only straighten your leg as far as you can without making your symptoms worse. 3. Hold for __________ seconds. 4. Slowly return to the starting position. 5. Repeat with your other leg. Repeat __________ times. Complete this exercise __________ times a day. Exercise B: Knee to chest with hip adduction and internal  rotation  1. Lie on your back on a firm surface with both legs straight. 2. Bend one of your knees and move it up toward your chest until you feel a gentle stretch in your lower back and buttock. Then, move your knee toward the shoulder that is on the opposite side from your leg. ? Hold your leg in this position by holding onto the front of your knee. 3. Hold for __________ seconds. 4. Slowly return to the starting position. 5. Repeat with your other leg. Repeat __________ times. Complete this exercise __________ times a day. Exercise C: Prone extension on elbows  1. Lie on your abdomen on a firm surface. A bed may be too soft for this exercise. 2. Prop yourself up on your elbows. 3. Use your arms to help lift your chest up until you feel a gentle stretch in your abdomen and your lower back. ? This will place some of your body weight on your elbows. If this is uncomfortable, try stacking pillows under your chest. ? Your hips should stay down, against the surface that you are lying on. Keep your hip and back muscles relaxed. 4. Hold for __________ seconds. 5. Slowly relax your upper body and return to the starting position. Repeat __________ times. Complete this exercise __________ times a day. Strengthening exercises These exercises build strength and endurance in your back. Endurance is the ability to use your muscles for a long time, even after they get tired. Exercise D: Pelvic  tilt 1. Lie on your back on a firm surface. Bend your knees and keep your feet flat. 2. Tense your abdominal muscles. Tip your pelvis up toward the ceiling and flatten your lower back into the floor. ? To help with this exercise, you may place a small towel under your lower back and try to push your back into the towel. 3. Hold for __________ seconds. 4. Let your muscles relax completely before you repeat this exercise. Repeat __________ times. Complete this exercise __________ times a day. Exercise E:  Alternating arm and leg raises  1. Get on your hands and knees on a firm surface. If you are on a hard floor, you may want to use padding to cushion your knees, such as an exercise mat. 2. Line up your arms and legs. Your hands should be below your shoulders, and your knees should be below your hips. 3. Lift your left leg behind you. At the same time, raise your right arm and straighten it in front of you. ? Do not lift your leg higher than your hip. ? Do not lift your arm higher than your shoulder. ? Keep your abdominal and back muscles tight. ? Keep your hips facing the ground. ? Do not arch your back. ? Keep your balance carefully, and do not hold your breath. 4. Hold for __________ seconds. 5. Slowly return to the starting position and repeat with your right leg and your left arm. Repeat __________ times. Complete this exercise __________ times a day. Posture and body mechanics  Body mechanics refers to the movements and positions of your body while you do your daily activities. Posture is part of body mechanics. Good posture and healthy body mechanics can help to relieve stress in your body's tissues and joints. Good posture means that your spine is in its natural S-curve position (your spine is neutral), your shoulders are pulled back slightly, and your head is not tipped forward. The following are general guidelines for applying improved posture and body mechanics to your everyday activities. Standing   When standing, keep your spine neutral and your feet about hip-width apart. Keep a slight bend in your knees. Your ears, shoulders, and hips should line up.  When you do a task in which you stand in one place for a long time, place one foot up on a stable object that is 2-4 inches (5-10 cm) high, such as a footstool. This helps keep your spine neutral. Sitting   When sitting, keep your spine neutral and keep your feet flat on the floor. Use a footrest, if necessary, and keep your  thighs parallel to the floor. Avoid rounding your shoulders, and avoid tilting your head forward.  When working at a desk or a computer, keep your desk at a height where your hands are slightly lower than your elbows. Slide your chair under your desk so you are close enough to maintain good posture.  When working at a computer, place your monitor at a height where you are looking straight ahead and you do not have to tilt your head forward or downward to look at the screen. Resting   When lying down and resting, avoid positions that are most painful for you.  If you have pain with activities such as sitting, bending, stooping, or squatting (flexion-based activities), lie in a position in which your body does not bend very much. For example, avoid curling up on your side with your arms and knees near your chest (fetal position).  If you have pain with activities such as standing for a long time or reaching with your arms (extension-based activities), lie with your spine in a neutral position and bend your knees slightly. Try the following positions: ? Lying on your side with a pillow between your knees. ? Lying on your back with a pillow under your knees. Lifting   When lifting objects, keep your feet at least shoulder-width apart and tighten your abdominal muscles.  Bend your knees and hips and keep your spine neutral. It is important to lift using the strength of your legs, not your back. Do not lock your knees straight out.  Always ask for help to lift heavy or awkward objects. This information is not intended to replace advice given to you by your health care provider. Make sure you discuss any questions you have with your health care provider. Document Released: 07/18/2005 Document Revised: 03/24/2016 Document Reviewed: 04/03/2015 Elsevier Interactive Patient Education  [employer]2018 [employer]

## 2017-06-25 ENCOUNTER — Other Ambulatory Visit: Payer: Self-pay | Admitting: Adult Health

## 2017-06-25 ENCOUNTER — Encounter: Payer: Self-pay | Admitting: Adult Health

## 2017-06-25 DIAGNOSIS — M545 Low back pain, unspecified: Secondary | ICD-10-CM

## 2017-06-25 MED ORDER — PREDNISONE 20 MG PO TABS: ORAL_TABLET | ORAL | 0 refills | Status: DC

## 2017-08-01 NOTE — Progress Notes (Signed)
Complete Physical  Assessment and Plan:  Essential hypertension - continue medications, DASH diet, exercise and monitor at home. Call if greater than 130/80.  -     CBC with Differential/Platelet -     BASIC METABOLIC PANEL WITH GFR -     Hepatic function panel -     TSH -     Urinalysis, Routine w reflex microscopic -     Microalbumin / creatinine urine ratio  DDD (degenerative disc disease), lumbar Suggest PT, declines at this time  Stage 1 chronic kidney disease Increase fluids, avoid NSAIDS, monitor sugars, will monitor -     BASIC METABOLIC PANEL WITH GFR  Hyperlipidemia, mixed -continue medications, check lipids, decrease fatty foods, increase activity.  -     Lipid panel  Testosterone deficiency Try 1 pump and the zinc, recheck next OV -     Testosterone  Insulin resistance -     Hemoglobin A1c  Medication management -     Magnesium  HSV-1 (herpes simplex virus 1) infection Monitor symptoms  Insomnia, unspecified type Insomnia- good sleep hygiene discussed, increase day time activity, try melatonin or benadryl if this does not help we will call in sleep medication.   Routine general medical examination at a health care facility 1 year  Vitamin D deficiency -     VITAMIN D 25 Hydroxy (Vit-D Deficiency, Fractures)  OSA (obstructive sleep apnea) Sleep apnea- continue CPAP, CPAP is helping with daytime fatigue, weight loss still advised.   Erectile dysfunction, unspecified erectile dysfunction type -     sildenafil (VIAGRA) 100 MG tablet; 1/2-1 pill as needed 1 hour before intercourse, can get with good RX [facility]  Discussed med's effects and SE's. Screening labs and tests as requested with regular follow-up as recommended.  HPI Patient presents for a complete physical.   Has lower back pain, stretches, uses back brace, and monitors it.   His blood pressure has been controlled at home, today their BP is BP: 110/76 He does workout. He denies  chest pain, shortness of breath, dizziness.  He reports that he is doing around 25 minutes of elliptical 3 days per week.     He is on cholesterol medication and denies myalgias. His cholesterol is at goal. The cholesterol last visit was:   Lab Results  Component Value Date   CHOL 191 02/07/2017   HDL 38 (L) 02/07/2017   LDLCALC 107 (H) 02/07/2017   TRIG 232 (H) 02/07/2017   CHOLHDL 5.0 (H) 02/07/2017   . Last A1C in the office was:  Lab Results  Component Value Date   HGBA1C 5.3 02/07/2017   Patient is on Vitamin D supplement, doubled dose last visit, on 10,000 a day.   Lab Results  Component Value Date   VD25OH 1346 02/07/2017   He has a history of testosterone deficiency and is on testosterone replacement. He states that the testosterone helps with his energy, libido, muscle mass. Lab Results  Component Value Date   TESTOSTERONE 696 02/07/2017   BMI is Body mass index is 27.86 kg/m., he is working on diet and exercise. Wt Readings from Last 3 Encounters:  08/02/17 170 lb (77.1 kg)  06/21/17 166 lb 12.8 oz (75.7 kg)  06/12/17 166 lb 9.6 oz (75.6 kg)      Current Medications:  Current Outpatient Medications on File Prior to Visit  Medication Sig Dispense Refill  . ANDROGEL PUMP 20.25 MG/ACT (1.62%) GEL APPLY 2 PUMPS TO THE SKIN QD  2  .  bisoprolol-hydrochlorothiazide (ZIAC) 5-6.25 MG tablet Take 1 tablet daily for BP (Patient taking differently: Take 1 tablet by mouth every evening. Take 1 tablet daily for BP) 90 tablet 1  . Cholecalciferol (VITAMIN D3) 5000 units CAPS Take 2 capsules by mouth daily.    . cyclobenzaprine (FLEXERIL) 5 MG tablet Take 1 tablet (5 mg total) by mouth 3 (three) times daily as needed for muscle spasms. 60 tablet 0  . dexamethasone (DECADRON) 0.5 MG/5ML solution Take 0.5 mg by mouth 4 (four) times daily as needed. Oral rinse for kanker sores as needed    . HYDROcodone-acetaminophen (NORCO) 5-325 MG tablet Take 1 tablet by mouth every 4 (four) hours  as needed for moderate pain. 30 tablet 0  . lisinopril (PRINIVIL,ZESTRIL) 10 MG tablet TAKE 1 TABLET BY MOUTH DAILY 90 tablet 0  . Multiple Vitamins-Minerals (MULTIVITAMIN PO) Take by mouth.    . Omega 3-6-9 Fatty Acids (OMEGA 3-6-9 COMPLEX PO) Take by mouth every evening.     . rosuvastatin (CRESTOR) 20 MG tablet TAKE 1 TABLET BY MOUTH DAILY 90 tablet 0  . Zinc 50 MG TABS Take 1 tablet by mouth every evening.     No current facility-administered medications on file prior to visit.     Health Maintenance:  Immunization History  Administered Date(s) Administered  . Tdap 05/04/2016   Tetanus: 05/04/16 Flu vaccine:  2018  Colonoscopy: Due in 2019 for 5 year recheck, first degree relative with colon cancer  Patient Care Team: Lucky Cowboy, MD as PCP - General (Internal Medicine) Sharrell Ku, MD as Consulting Physician (Gastroenterology)  Medical History:  Past Medical History:  Diagnosis Date  . DDD (degenerative disc disease), lumbar   . Hyperlipidemia, mixed   . Hypertension   . Hypogonadism male   . Penile wart   . Vitamin D deficiency   . Wears contact lenses    Allergies No Known Allergies  SURGICAL HISTORY He  has a past surgical history that includes Lumbar microdiscectomy (12/29/2004); Inguinal hernia repair (Left, 2012  approx.); Penile biopsy (N/A, 02/27/2017); and Co2 laser application (02/27/2017).   FAMILY HISTORY His family history includes Cancer (age of onset: 43) in his father; Diabetes in his brother; Hypotension in his mother.   SOCIAL HISTORY He  reports that  has never smoked. he has never used smokeless tobacco. He reports that he drinks alcohol. He reports that he does not use drugs. Very rare alcohol use, 2-3 x a month  Review of Systems:  Review of Systems  Constitutional: Negative for chills, fever and malaise/fatigue.  HENT: Negative for congestion, ear pain and sore throat.   Eyes: Negative.   Respiratory: Negative for cough, shortness  of breath and wheezing.   Cardiovascular: Negative for chest pain, palpitations and leg swelling.  Gastrointestinal: Negative for abdominal pain, blood in stool, constipation, diarrhea, heartburn and melena.  Genitourinary: Negative.   Skin: Negative.   Neurological: Negative for dizziness, sensory change, loss of consciousness and headaches.  Psychiatric/Behavioral: Negative for depression. The patient is not nervous/anxious and does not have insomnia.     Physical Exam: Estimated body mass index is 27.86 kg/m as calculated from the following:   Height as of this encounter: 5' 5.5" (1.664 m).   Weight as of this encounter: 170 lb (77.1 kg). BP 110/76   Pulse 66   Temp 97.7 F (36.5 C)   Ht 5' 5.5" (1.664 m)   Wt 170 lb (77.1 kg)   SpO2 97%   BMI 27.86 kg/m  General Appearance: Well nourished, in no apparent distress. Alert, attentive. Eyes: PERRLA, EOMs, conjunctiva no swelling or erythema ENT/Mouth: Ear canals clear bilaterally with no erythema, swelling, discharge.  TMs normal bilaterally with no erythema, bulging, or retractions.  Oropharynx clear and moist with no exudate, swelling, or erythema.  Dentition normal.   Neck: Supple, thyroid normal. No bruits, JVD, cervical adenopathy Respiratory: Respiratory effort normal, BS equal bilaterally without rales, rhonchi, wheezing or stridor.  Cardio: RRR without murmurs, rubs or gallops. Brisk peripheral pulses without edema. No bruits in the bilateral carotids.   Chest: symmetric, with normal excursions Abdomen: Soft, nontender, no guarding, rebound, hernias, masses, or organomegaly. Musculoskeletal: Full ROM all peripheral extremities,5/5 strength, and normal gait.  Skin: Warm, dry without rashes, lesions, ecchymosis. Neuro: A&Ox3, Cranial nerves intact, reflexes equal bilaterally. Normal muscle tone, no cerebellar symptoms. Sensation intact.  Psych: Normal affect, Insight and Judgment appropriate.   EKG: defer this age  Over  40 minutes of exam, counseling, chart review and critical decision making was performed  [name] 3:24 PM [facility]

## 2017-08-02 ENCOUNTER — Encounter: Payer: Self-pay | Admitting: Physician Assistant

## 2017-08-02 ENCOUNTER — Encounter: Payer: Self-pay | Admitting: Internal Medicine

## 2017-08-02 ENCOUNTER — Ambulatory Visit: Payer: BLUE CROSS/BLUE SHIELD | Admitting: Physician Assistant

## 2017-08-02 VITALS — BP 110/76 | HR 66 | Temp 97.7°F | Ht 65.5 in | Wt 170.0 lb

## 2017-08-02 DIAGNOSIS — I10 Essential (primary) hypertension: Secondary | ICD-10-CM

## 2017-08-02 DIAGNOSIS — E8881 Metabolic syndrome: Secondary | ICD-10-CM

## 2017-08-02 DIAGNOSIS — Z79899 Other long term (current) drug therapy: Secondary | ICD-10-CM | POA: Diagnosis not present

## 2017-08-02 DIAGNOSIS — N181 Chronic kidney disease, stage 1: Secondary | ICD-10-CM

## 2017-08-02 DIAGNOSIS — N529 Male erectile dysfunction, unspecified: Secondary | ICD-10-CM

## 2017-08-02 DIAGNOSIS — G4733 Obstructive sleep apnea (adult) (pediatric): Secondary | ICD-10-CM

## 2017-08-02 DIAGNOSIS — Z0000 Encounter for general adult medical examination without abnormal findings: Secondary | ICD-10-CM | POA: Diagnosis not present

## 2017-08-02 DIAGNOSIS — E559 Vitamin D deficiency, unspecified: Secondary | ICD-10-CM

## 2017-08-02 DIAGNOSIS — E782 Mixed hyperlipidemia: Secondary | ICD-10-CM

## 2017-08-02 DIAGNOSIS — B009 Herpesviral infection, unspecified: Secondary | ICD-10-CM

## 2017-08-02 DIAGNOSIS — E349 Endocrine disorder, unspecified: Secondary | ICD-10-CM | POA: Diagnosis not present

## 2017-08-02 DIAGNOSIS — G4700 Insomnia, unspecified: Secondary | ICD-10-CM

## 2017-08-02 DIAGNOSIS — M5136 Other intervertebral disc degeneration, lumbar region: Secondary | ICD-10-CM

## 2017-08-02 MED ORDER — SILDENAFIL CITRATE 100 MG PO TABS: ORAL_TABLET | ORAL | 2 refills | Status: DC

## 2017-08-02 NOTE — Patient Instructions (Signed)
We can refer to [facility] or ortho if not improving or gets worse  Go to the ER if you have any new weakness in your legs, have trouble controlling your urine or bowels, or have worsening pain.    Back pain Rehab Ask your health care provider which exercises are safe for you. Do exercises exactly as told by your health care provider and adjust them as directed. It is normal to feel mild stretching, pulling, tightness, or discomfort as you do these exercises, but you should stop right away if you feel sudden pain or your pain gets worse.Do not begin these exercises until told by your health care provider. Stretching and range of motion exercises These exercises warm up your muscles and joints and improve the movement and flexibility of your hips and your back. These exercises also help to relieve pain, numbness, and tingling. Exercise A: Sciatic nerve glide 1. Sit in a chair with your head facing down toward your chest. Place your hands behind your back. Let your shoulders slump forward. 2. Slowly straighten one of your knees while you tilt your head back as if you are looking toward the ceiling. Only straighten your leg as far as you can without making your symptoms worse. 3. Hold for __________ seconds. 4. Slowly return to the starting position. 5. Repeat with your other leg. Repeat __________ times. Complete this exercise __________ times a day. Exercise B: Knee to chest with hip adduction and internal rotation  1. Lie on your back on a firm surface with both legs straight. 2. Bend one of your knees and move it up toward your chest until you feel a gentle stretch in your lower back and buttock. Then, move your knee toward the shoulder that is on the opposite side from your leg. ? Hold your leg in this position by holding onto the front of your knee. 3. Hold for __________ seconds. 4. Slowly return to the starting position. 5. Repeat with your other leg. Repeat __________ times. Complete this  exercise __________ times a day. Exercise C: Prone extension on elbows  1. Lie on your abdomen on a firm surface. A bed may be too soft for this exercise. 2. Prop yourself up on your elbows. 3. Use your arms to help lift your chest up until you feel a gentle stretch in your abdomen and your lower back. ? This will place some of your body weight on your elbows. If this is uncomfortable, try stacking pillows under your chest. ? Your hips should stay down, against the surface that you are lying on. Keep your hip and back muscles relaxed. 4. Hold for __________ seconds. 5. Slowly relax your upper body and return to the starting position. Repeat __________ times. Complete this exercise __________ times a day. Strengthening exercises These exercises build strength and endurance in your back. Endurance is the ability to use your muscles for a long time, even after they get tired. Exercise D: Pelvic tilt 1. Lie on your back on a firm surface. Bend your knees and keep your feet flat. 2. Tense your abdominal muscles. Tip your pelvis up toward the ceiling and flatten your lower back into the floor. ? To help with this exercise, you may place a small towel under your lower back and try to push your back into the towel. 3. Hold for __________ seconds. 4. Let your muscles relax completely before you repeat this exercise. Repeat __________ times. Complete this exercise __________ times a day. Exercise E: Alternating arm  and leg raises  1. Get on your hands and knees on a firm surface. If you are on a hard floor, you may want to use padding to cushion your knees, such as an exercise mat. 2. Line up your arms and legs. Your hands should be below your shoulders, and your knees should be below your hips. 3. Lift your left leg behind you. At the same time, raise your right arm and straighten it in front of you. ? Do not lift your leg higher than your hip. ? Do not lift your arm higher than your  shoulder. ? Keep your abdominal and back muscles tight. ? Keep your hips facing the ground. ? Do not arch your back. ? Keep your balance carefully, and do not hold your breath. 4. Hold for __________ seconds. 5. Slowly return to the starting position and repeat with your right leg and your left arm. Repeat __________ times. Complete this exercise __________ times a day. Posture and body mechanics  Body mechanics refers to the movements and positions of your body while you do your daily activities. Posture is part of body mechanics. Good posture and healthy body mechanics can help to relieve stress in your body's tissues and joints. Good posture means that your spine is in its natural S-curve position (your spine is neutral), your shoulders are pulled back slightly, and your head is not tipped forward. The following are general guidelines for applying improved posture and body mechanics to your everyday activities. Standing   When standing, keep your spine neutral and your feet about hip-width apart. Keep a slight bend in your knees. Your ears, shoulders, and hips should line up.  When you do a task in which you stand in one place for a long time, place one foot up on a stable object that is 2-4 inches (5-10 cm) high, such as a footstool. This helps keep your spine neutral. Sitting   When sitting, keep your spine neutral and keep your feet flat on the floor. Use a footrest, if necessary, and keep your thighs parallel to the floor. Avoid rounding your shoulders, and avoid tilting your head forward.  When working at a desk or a computer, keep your desk at a height where your hands are slightly lower than your elbows. Slide your chair under your desk so you are close enough to maintain good posture.  When working at a computer, place your monitor at a height where you are looking straight ahead and you do not have to tilt your head forward or downward to look at the screen. Resting   When  lying down and resting, avoid positions that are most painful for you.  If you have pain with activities such as sitting, bending, stooping, or squatting (flexion-based activities), lie in a position in which your body does not bend very much. For example, avoid curling up on your side with your arms and knees near your chest (fetal position).  If you have pain with activities such as standing for a long time or reaching with your arms (extension-based activities), lie with your spine in a neutral position and bend your knees slightly. Try the following positions: ? Lying on your side with a pillow between your knees. ? Lying on your back with a pillow under your knees. Lifting   When lifting objects, keep your feet at least shoulder-width apart and tighten your abdominal muscles.  Bend your knees and hips and keep your spine neutral. It is important to lift  using the strength of your legs, not your back. Do not lock your knees straight out.  Always ask for help to lift heavy or awkward objects. This information is not intended to replace advice given to you by your health care provider. Make sure you discuss any questions you have with your health care provider. Document Released: 07/18/2005 Document Revised: 03/24/2016 Document Reviewed: 04/03/2015 Elsevier Interactive Patient Education  [employer].

## 2017-08-03 LAB — URINALYSIS, ROUTINE W REFLEX MICROSCOPIC
Bilirubin Urine: NEGATIVE
Glucose, UA: NEGATIVE
Hgb urine dipstick: NEGATIVE
KETONES UR: NEGATIVE
LEUKOCYTES UA: NEGATIVE
NITRITE: NEGATIVE
PH: 5.5 (ref 5.0–8.0)
Protein, ur: NEGATIVE
SPECIFIC GRAVITY, URINE: 1.01 (ref 1.001–1.03)

## 2017-08-03 LAB — HEPATIC FUNCTION PANEL
AG RATIO: 2.5 (calc) (ref 1.0–2.5)
ALT: 43 U/L (ref 9–46)
AST: 25 U/L (ref 10–35)
Albumin: 4.8 g/dL (ref 3.6–5.1)
Alkaline phosphatase (APISO): 63 U/L (ref 40–115)
BILIRUBIN DIRECT: 0.1 mg/dL (ref 0.0–0.2)
BILIRUBIN INDIRECT: 0.4 mg/dL (ref 0.2–1.2)
BILIRUBIN TOTAL: 0.5 mg/dL (ref 0.2–1.2)
Globulin: 1.9 g/dL (calc) (ref 1.9–3.7)
Total Protein: 6.7 g/dL (ref 6.1–8.1)

## 2017-08-03 LAB — CBC WITH DIFFERENTIAL/PLATELET
BASOS PCT: 0.4 %
Basophils Absolute: 31 cells/uL (ref 0–200)
EOS ABS: 148 {cells}/uL (ref 15–500)
Eosinophils Relative: 1.9 %
HCT: 49.7 % (ref 38.5–50.0)
HEMOGLOBIN: 16.9 g/dL (ref 13.2–17.1)
Lymphs Abs: 2473 cells/uL (ref 850–3900)
MCH: 31.1 pg (ref 27.0–33.0)
MCHC: 34 g/dL (ref 32.0–36.0)
MCV: 91.4 fL (ref 80.0–100.0)
MPV: 10.6 fL (ref 7.5–12.5)
Monocytes Relative: 10.1 %
Neutro Abs: 4360 cells/uL (ref 1500–7800)
Neutrophils Relative %: 55.9 %
PLATELETS: 251 10*3/uL (ref 140–400)
RBC: 5.44 10*6/uL (ref 4.20–5.80)
RDW: 12.2 % (ref 11.0–15.0)
TOTAL LYMPHOCYTE: 31.7 %
WBC: 7.8 10*3/uL (ref 3.8–10.8)
WBCMIX: 788 {cells}/uL (ref 200–950)

## 2017-08-03 LAB — LIPID PANEL
CHOL/HDL RATIO: 5.1 (calc) — AB (ref <5.0)
Cholesterol: 202 mg/dL — ABNORMAL HIGH (ref <200)
HDL: 40 mg/dL — AB (ref >40)
LDL Cholesterol (Calc): 127 mg/dL (calc) — ABNORMAL HIGH
NON-HDL CHOLESTEROL (CALC): 162 mg/dL — AB (ref <130)
Triglycerides: 213 mg/dL — ABNORMAL HIGH (ref <150)

## 2017-08-03 LAB — BASIC METABOLIC PANEL WITH GFR
BUN: 17 mg/dL (ref 7–25)
CO2: 33 mmol/L — ABNORMAL HIGH (ref 20–32)
CREATININE: 1.15 mg/dL (ref 0.70–1.33)
Calcium: 10.4 mg/dL — ABNORMAL HIGH (ref 8.6–10.3)
Chloride: 100 mmol/L (ref 98–110)
GFR, EST AFRICAN AMERICAN: 81 mL/min/{1.73_m2} (ref >=60)
GFR, EST NON AFRICAN AMERICAN: 70 mL/min/{1.73_m2} (ref >=60)
Glucose, Bld: 75 mg/dL (ref 65–99)
Potassium: 4.3 mmol/L (ref 3.5–5.3)
SODIUM: 140 mmol/L (ref 135–146)

## 2017-08-03 LAB — HEMOGLOBIN A1C
EAG (MMOL/L): 6.2 (calc)
HEMOGLOBIN A1C: 5.5 %{Hb} (ref <5.7)
Mean Plasma Glucose: 111 (calc)

## 2017-08-03 LAB — MICROALBUMIN / CREATININE URINE RATIO
Creatinine, Urine: 64 mg/dL (ref 20–320)
MICROALB UR: 0.2 mg/dL
MICROALB/CREAT RATIO: 3 ug/mg{creat} (ref <30)

## 2017-08-03 LAB — VITAMIN D 25 HYDROXY (VIT D DEFICIENCY, FRACTURES): Vit D, 25-Hydroxy: 64 ng/mL (ref 30–100)

## 2017-08-03 LAB — TESTOSTERONE: Testosterone: 260 ng/dL (ref 250–827)

## 2017-08-03 LAB — MAGNESIUM: Magnesium: 2.3 mg/dL (ref 1.5–2.5)

## 2017-08-03 LAB — TSH: TSH: 0.73 mIU/L (ref 0.40–4.50)

## 2017-08-10 ENCOUNTER — Other Ambulatory Visit: Payer: Self-pay | Admitting: Internal Medicine

## 2017-08-22 DIAGNOSIS — A630 Anogenital (venereal) warts: Secondary | ICD-10-CM | POA: Diagnosis not present

## 2017-09-25 ENCOUNTER — Other Ambulatory Visit: Payer: Self-pay | Admitting: Internal Medicine

## 2017-10-02 ENCOUNTER — Other Ambulatory Visit: Payer: Self-pay | Admitting: *Deleted

## 2017-10-02 DIAGNOSIS — I10 Essential (primary) hypertension: Secondary | ICD-10-CM

## 2017-10-02 MED ORDER — BISOPROLOL-HYDROCHLOROTHIAZIDE 5-6.25 MG PO TABS: ORAL_TABLET | ORAL | 1 refills | Status: DC

## 2017-10-24 ENCOUNTER — Other Ambulatory Visit: Payer: Self-pay | Admitting: Internal Medicine

## 2017-12-03 ENCOUNTER — Other Ambulatory Visit: Payer: Self-pay | Admitting: Adult Health

## 2017-12-03 ENCOUNTER — Encounter: Payer: Self-pay | Admitting: Adult Health

## 2017-12-03 MED ORDER — PREDNISONE 20 MG PO TABS: ORAL_TABLET | ORAL | 0 refills | Status: AC

## 2018-01-16 DIAGNOSIS — M9903 Segmental and somatic dysfunction of lumbar region: Secondary | ICD-10-CM | POA: Diagnosis not present

## 2018-01-16 DIAGNOSIS — M5126 Other intervertebral disc displacement, lumbar region: Secondary | ICD-10-CM | POA: Diagnosis not present

## 2018-01-16 DIAGNOSIS — M9905 Segmental and somatic dysfunction of pelvic region: Secondary | ICD-10-CM | POA: Diagnosis not present

## 2018-01-16 DIAGNOSIS — M9904 Segmental and somatic dysfunction of sacral region: Secondary | ICD-10-CM | POA: Diagnosis not present

## 2018-01-23 DIAGNOSIS — M9905 Segmental and somatic dysfunction of pelvic region: Secondary | ICD-10-CM | POA: Diagnosis not present

## 2018-01-23 DIAGNOSIS — M9903 Segmental and somatic dysfunction of lumbar region: Secondary | ICD-10-CM | POA: Diagnosis not present

## 2018-01-23 DIAGNOSIS — M9904 Segmental and somatic dysfunction of sacral region: Secondary | ICD-10-CM | POA: Diagnosis not present

## 2018-01-23 DIAGNOSIS — M5126 Other intervertebral disc displacement, lumbar region: Secondary | ICD-10-CM | POA: Diagnosis not present

## 2018-01-31 DIAGNOSIS — E663 Overweight: Secondary | ICD-10-CM | POA: Insufficient documentation

## 2018-01-31 NOTE — Progress Notes (Signed)
FOLLOW UP  Assessment and Plan:   Hypertension Well controlled with current medications  Monitor blood pressure at home; patient to call if consistently greater than 130/80 Continue DASH diet.   Reminder to go to the ER if any CP, SOB, nausea, dizziness, severe HA, changes vision/speech, left arm numbness and tingling and jaw pain.  Cholesterol Currently above goal; currently taking rosuvastatin 20 mg, previously well controlled on this dose Continue low cholesterol diet and exercise.  Check lipid panel.   Other abnormal gluocse Recent A1Cs at goal Discussed diet/exercise, weight management  Defer A1C; check BMP  Overweight Long discussion about weight loss, diet, and exercise Recommended diet heavy in fruits and veggies and low in animal meats, cheeses, and dairy products, appropriate calorie intake Discussed ideal weight for height Will follow up in 3 months  Hypogonadism - continue replacement therapy, check testosterone levels as needed.  - check today per patient request  Vitamin D Def At goal at last visit; continue supplementation to maintain goal of 70-100 Defer Vit D level  Continue diet and meds as discussed. Further disposition pending results of labs. Discussed med's effects and SE's.   Over 30 minutes of exam, counseling, chart review, and critical decision making was performed.   Future Appointments  Date Time Provider Department Center  08/14/2018  3:00 PM [name], PA-C [facility] None    ----------------------------------------------------------------------------------------------------------------------  HPI 57 y.o. male  presents for 3 month follow up on hypertension, cholesterol, glucose management, testosterone deficiency, weight and vitamin D deficiency.   BMI is Body mass index is 27.66 kg/m., he has been working on diet and exercise. Wt Readings from Last 3 Encounters:  02/05/18 168 lb 12.8 oz (76.6 kg)  08/02/17 170 lb (77.1 kg)   06/21/17 166 lb 12.8 oz (75.7 kg)   His blood pressure has been controlled at home, today their BP is BP: 134/82  He does workout. He denies chest pain, shortness of breath, dizziness.   He is on cholesterol medication (rosuvastatin 20 mg daily, no recent dose changes) and denies myalgias. His cholesterol is not at goal. The cholesterol last visit was:   Lab Results  Component Value Date   CHOL 202 (H) 08/02/2017   HDL 40 (L) 08/02/2017   LDLCALC 127 (H) 08/02/2017   TRIG 213 (H) 08/02/2017   CHOLHDL 5.1 (H) 08/02/2017    He has been working on diet and exercise for glucose management/insulin resistance, and denies foot ulcerations, increased appetite, nausea, paresthesia of the feet, polydipsia, polyuria, visual disturbances, vomiting and weight loss. Last A1C in the office was:  Lab Results  Component Value Date   HGBA1C 5.5 08/02/2017   Patient is on Vitamin D supplement.   Lab Results  Component Value Date   VD25OH 64 08/02/2017     He has a history of testosterone deficiency and is on testosterone replacement. He states that the testosterone helps with his energy, libido, muscle mass. Lab Results  Component Value Date   TESTOSTERONE 260 08/02/2017     Current Medications:  Current Outpatient Medications on File Prior to Visit  Medication Sig  . ANDROGEL PUMP 20.25 MG/ACT (1.62%) GEL APPLY 2 PUMPS TO THE SKIN DAILY (Patient taking differently: APPLY 1 PUMP TO THE SKIN DAILY)  . bisoprolol-hydrochlorothiazide (ZIAC) 5-6.25 MG tablet Take 1 tablet daily for BP  . Cholecalciferol (VITAMIN D3) 5000 units CAPS Take 2 capsules by mouth daily.  . cyclobenzaprine (FLEXERIL) 5 MG tablet Take 1 tablet (5 mg total) by mouth  3 (three) times daily as needed for muscle spasms.  Marland Kitchen. dexamethasone (DECADRON) 0.5 MG/5ML solution Take 0.5 mg by mouth 4 (four) times daily as needed. Oral rinse for kanker sores as needed  . HYDROcodone-acetaminophen (NORCO) 5-325 MG tablet Take 1 tablet by  mouth every 4 (four) hours as needed for moderate pain.  Marland Kitchen. lisinopril (PRINIVIL,ZESTRIL) 10 MG tablet TAKE 1 TABLET BY MOUTH DAILY  . Multiple Vitamins-Minerals (MULTIVITAMIN PO) Take by mouth.  . Omega 3-6-9 Fatty Acids (OMEGA 3-6-9 COMPLEX PO) Take by mouth every evening.   . rosuvastatin (CRESTOR) 20 MG tablet TAKE 1 TABLET BY MOUTH DAILY  . sildenafil (VIAGRA) 100 MG tablet 1/2-1 pill as needed 1 hour before intercourse, can get with good RX from [facility]  . Zinc 50 MG TABS Take 1 tablet by mouth every evening.   No current facility-administered medications on file prior to visit.      Allergies: No Known Allergies   Medical History:  Past Medical History:  Diagnosis Date  . DDD (degenerative disc disease), lumbar   . Hyperlipidemia, mixed   . Hypertension   . Hypogonadism male   . Penile wart   . Vitamin D deficiency   . Wears contact lenses    Family history- Reviewed and unchanged Social history- Reviewed and unchanged   Review of Systems:  Review of Systems  Constitutional: Negative for malaise/fatigue and weight loss.  HENT: Negative for hearing loss and tinnitus.   Eyes: Negative for blurred vision and double vision.  Respiratory: Negative for cough, shortness of breath and wheezing.   Cardiovascular: Negative for chest pain, palpitations, orthopnea, claudication and leg swelling.  Gastrointestinal: Negative for abdominal pain, blood in stool, constipation, diarrhea, heartburn, melena, nausea and vomiting.  Genitourinary: Negative.   Musculoskeletal: Negative for joint pain and myalgias.  Skin: Negative for rash.  Neurological: Negative for dizziness, tingling, sensory change, weakness and headaches.  Endo/Heme/Allergies: Negative for polydipsia.  Psychiatric/Behavioral: Negative.   All other systems reviewed and are negative.     Physical Exam: BP 134/82   Pulse 67   Temp 97.7 F (36.5 C)   Ht 5' 5.5" (1.664 m)   Wt 168 lb 12.8 oz (76.6 kg)    SpO2 96%   BMI 27.66 kg/m  Wt Readings from Last 3 Encounters:  02/05/18 168 lb 12.8 oz (76.6 kg)  08/02/17 170 lb (77.1 kg)  06/21/17 166 lb 12.8 oz (75.7 kg)   General Appearance: Well nourished, in no apparent distress. Eyes: PERRLA, EOMs, conjunctiva no swelling or erythema Sinuses: No Frontal/maxillary tenderness ENT/Mouth: Ext aud canals clear, TMs without erythema, bulging. No erythema, swelling, or exudate on post pharynx.  Tonsils not swollen or erythematous. Hearing normal.  Neck: Supple, thyroid normal.  Respiratory: Respiratory effort normal, BS equal bilaterally without rales, rhonchi, wheezing or stridor.  Cardio: RRR with no MRGs. Brisk peripheral pulses without edema.  Abdomen: Soft, + BS.  Non tender, no guarding, rebound, hernias, masses. Lymphatics: Non tender without lymphadenopathy.  Musculoskeletal: Full ROM, 5/5 strength, Normal gait Skin: Warm, dry without rashes, lesions, ecchymosis.  Neuro: Cranial nerves intact. No cerebellar symptoms.  Psych: Awake and oriented X 3, normal affect, Insight and Judgment appropriate.    Dan MakerAshley C Daekwon Beswick, NP 8:59 AM [facility]

## 2018-02-05 ENCOUNTER — Ambulatory Visit: Payer: BLUE CROSS/BLUE SHIELD | Admitting: Adult Health

## 2018-02-05 ENCOUNTER — Encounter: Payer: Self-pay | Admitting: Adult Health

## 2018-02-05 VITALS — BP 134/82 | HR 67 | Temp 97.7°F | Ht 65.5 in | Wt 168.8 lb

## 2018-02-05 DIAGNOSIS — N182 Chronic kidney disease, stage 2 (mild): Secondary | ICD-10-CM | POA: Diagnosis not present

## 2018-02-05 DIAGNOSIS — E663 Overweight: Secondary | ICD-10-CM

## 2018-02-05 DIAGNOSIS — R399 Unspecified symptoms and signs involving the genitourinary system: Secondary | ICD-10-CM | POA: Diagnosis not present

## 2018-02-05 DIAGNOSIS — E8881 Metabolic syndrome: Secondary | ICD-10-CM

## 2018-02-05 DIAGNOSIS — E559 Vitamin D deficiency, unspecified: Secondary | ICD-10-CM

## 2018-02-05 DIAGNOSIS — E349 Endocrine disorder, unspecified: Secondary | ICD-10-CM | POA: Diagnosis not present

## 2018-02-05 DIAGNOSIS — Z79899 Other long term (current) drug therapy: Secondary | ICD-10-CM

## 2018-02-05 DIAGNOSIS — I10 Essential (primary) hypertension: Secondary | ICD-10-CM | POA: Diagnosis not present

## 2018-02-05 DIAGNOSIS — E782 Mixed hyperlipidemia: Secondary | ICD-10-CM

## 2018-02-05 DIAGNOSIS — R7309 Other abnormal glucose: Secondary | ICD-10-CM

## 2018-02-05 NOTE — Patient Instructions (Signed)
Maintain a healthy weight (BMI <25)   Aim for 7+ servings of fruits and vegetables daily  80+ fluid ounces of water or unsweet tea for healthy kidneys  Limit animal fats in diet for cholesterol and heart health - choose grass fed whenever available  Aim for low stress - take time to unwind and care for your mental health  Aim for 150 min of moderate intensity exercise weekly for heart health, and weights twice weekly for bone health  Aim for 7-9 hours of sleep daily      When it comes to diets, agreement about the perfect plan isn't easy to find, even among the experts. Experts at the [employer] developed an idea known as the Healthy Eating Plate. Just imagine a plate divided into logical, healthy portions.  The emphasis is on diet quality:  Load up on vegetables and fruits - one-half of your plate: Aim for color and variety, and remember that potatoes don't count.  Go for whole grains - one-quarter of your plate: Whole wheat, barley, wheat berries, quinoa, oats, brown rice, and foods made with them. If you want pasta, go with whole wheat pasta.  Protein power - one-quarter of your plate: Fish, chicken, beans, and nuts are all healthy, versatile protein sources. Limit red meat.  The diet, however, does go beyond the plate, offering a few other suggestions.  Use healthy plant oils, such as olive, canola, soy, corn, sunflower and peanut. Check the labels, and avoid partially hydrogenated oil, which have unhealthy trans fats.  If you're thirsty, drink water. Coffee and tea are good in moderation, but skip sugary drinks and limit milk and dairy products to one or two daily servings.  The type of carbohydrate in the diet is more important than the amount. Some sources of carbohydrates, such as vegetables, fruits, whole grains, and beans-are healthier than others.  Finally, stay active.

## 2018-02-06 ENCOUNTER — Encounter: Payer: Self-pay | Admitting: Adult Health

## 2018-02-06 ENCOUNTER — Other Ambulatory Visit: Payer: Self-pay | Admitting: Adult Health

## 2018-02-06 DIAGNOSIS — R7989 Other specified abnormal findings of blood chemistry: Secondary | ICD-10-CM | POA: Insufficient documentation

## 2018-02-06 DIAGNOSIS — E0590 Thyrotoxicosis, unspecified without thyrotoxic crisis or storm: Secondary | ICD-10-CM

## 2018-02-06 LAB — CBC WITH DIFFERENTIAL/PLATELET
Basophils Absolute: 52 cells/uL (ref 0–200)
Basophils Relative: 0.8 %
EOS ABS: 163 {cells}/uL (ref 15–500)
Eosinophils Relative: 2.5 %
HCT: 47.2 % (ref 38.5–50.0)
Hemoglobin: 15.9 g/dL (ref 13.2–17.1)
Lymphs Abs: 2028 cells/uL (ref 850–3900)
MCH: 31.1 pg (ref 27.0–33.0)
MCHC: 33.7 g/dL (ref 32.0–36.0)
MCV: 92.4 fL (ref 80.0–100.0)
MPV: 10.4 fL (ref 7.5–12.5)
Monocytes Relative: 11.1 %
Neutro Abs: 3536 cells/uL (ref 1500–7800)
Neutrophils Relative %: 54.4 %
PLATELETS: 217 10*3/uL (ref 140–400)
RBC: 5.11 10*6/uL (ref 4.20–5.80)
RDW: 12.1 % (ref 11.0–15.0)
Total Lymphocyte: 31.2 %
WBC: 6.5 10*3/uL (ref 3.8–10.8)
WBCMIX: 722 {cells}/uL (ref 200–950)

## 2018-02-06 LAB — TESTOSTERONE: TESTOSTERONE: 974 ng/dL — AB (ref 250–827)

## 2018-02-06 LAB — COMPLETE METABOLIC PANEL WITH GFR
AG RATIO: 2.4 (calc) (ref 1.0–2.5)
ALT: 35 U/L (ref 9–46)
AST: 22 U/L (ref 10–35)
Albumin: 4.7 g/dL (ref 3.6–5.1)
Alkaline phosphatase (APISO): 61 U/L (ref 40–115)
BILIRUBIN TOTAL: 0.6 mg/dL (ref 0.2–1.2)
BUN: 17 mg/dL (ref 7–25)
CHLORIDE: 103 mmol/L (ref 98–110)
CO2: 30 mmol/L (ref 20–32)
CREATININE: 1.18 mg/dL (ref 0.70–1.33)
Calcium: 10.1 mg/dL (ref 8.6–10.3)
GFR, EST AFRICAN AMERICAN: 78 mL/min/{1.73_m2} (ref >=60)
GFR, Est Non African American: 67 mL/min/{1.73_m2} (ref >=60)
Globulin: 2 g/dL (calc) (ref 1.9–3.7)
Glucose, Bld: 101 mg/dL — ABNORMAL HIGH (ref 65–99)
Potassium: 5.1 mmol/L (ref 3.5–5.3)
SODIUM: 141 mmol/L (ref 135–146)
Total Protein: 6.7 g/dL (ref 6.1–8.1)

## 2018-02-06 LAB — LIPID PANEL
CHOL/HDL RATIO: 3.8 (calc) (ref <5.0)
CHOLESTEROL: 150 mg/dL (ref <200)
HDL: 40 mg/dL — ABNORMAL LOW (ref >40)
LDL CHOLESTEROL (CALC): 90 mg/dL
Non-HDL Cholesterol (Calc): 110 mg/dL (calc) (ref <130)
Triglycerides: 106 mg/dL (ref <150)

## 2018-02-06 LAB — TSH: TSH: 0.24 m[IU]/L — AB (ref 0.40–4.50)

## 2018-02-06 LAB — PSA: PSA: 1.9 ng/mL (ref <=4.0)

## 2018-03-08 ENCOUNTER — Other Ambulatory Visit: Payer: Self-pay

## 2018-03-12 ENCOUNTER — Other Ambulatory Visit: Payer: BLUE CROSS/BLUE SHIELD

## 2018-03-12 DIAGNOSIS — E0590 Thyrotoxicosis, unspecified without thyrotoxic crisis or storm: Secondary | ICD-10-CM

## 2018-03-13 LAB — TSH: TSH: 0.25 m[IU]/L — AB (ref 0.40–4.50)

## 2018-04-08 ENCOUNTER — Other Ambulatory Visit: Payer: Self-pay | Admitting: Internal Medicine

## 2018-04-08 ENCOUNTER — Other Ambulatory Visit: Payer: Self-pay | Admitting: Physician Assistant

## 2018-04-09 ENCOUNTER — Other Ambulatory Visit: Payer: Self-pay | Admitting: Adult Health

## 2018-04-09 DIAGNOSIS — I10 Essential (primary) hypertension: Secondary | ICD-10-CM

## 2018-04-09 MED ORDER — BISOPROLOL-HYDROCHLOROTHIAZIDE 5-6.25 MG PO TABS: ORAL_TABLET | ORAL | 1 refills | Status: DC

## 2018-04-17 ENCOUNTER — Other Ambulatory Visit: Payer: Self-pay

## 2018-04-17 DIAGNOSIS — I10 Essential (primary) hypertension: Secondary | ICD-10-CM

## 2018-04-17 MED ORDER — ROSUVASTATIN CALCIUM 20 MG PO TABS: ORAL_TABLET | ORAL | 0 refills | Status: DC

## 2018-04-17 MED ORDER — BISOPROLOL-HYDROCHLOROTHIAZIDE 5-6.25 MG PO TABS: ORAL_TABLET | ORAL | 1 refills | Status: DC

## 2018-04-17 NOTE — Telephone Encounter (Signed)
[facility] requesting a new prescription be sent with an authorizing provider.

## 2018-05-01 DIAGNOSIS — H2513 Age-related nuclear cataract, bilateral: Secondary | ICD-10-CM | POA: Diagnosis not present

## 2018-05-01 DIAGNOSIS — H31011 Macula scars of posterior pole (postinflammatory) (post-traumatic), right eye: Secondary | ICD-10-CM | POA: Diagnosis not present

## 2018-05-08 ENCOUNTER — Ambulatory Visit: Payer: Self-pay | Admitting: Adult Health

## 2018-05-21 NOTE — Progress Notes (Signed)
FOLLOW UP  Assessment and Plan:   Hypertension Well controlled with current medications  Monitor blood pressure at home; patient to call if consistently greater than 130/80 Continue DASH diet.   Reminder to go to the ER if any CP, SOB, nausea, dizziness, severe HA, changes vision/speech, left arm numbness and tingling and jaw pain.  Cholesterol Currently above goal; currently taking rosuvastatin 20 mg, previously well controlled on this dose Continue low cholesterol diet and exercise.  Check lipid panel.   Other abnormal gluocse Recent A1Cs at goal Discussed diet/exercise, weight management  Defer A1C; check BMP  Overweight Long discussion about weight loss, diet, and exercise Recommended diet heavy in fruits and veggies and low in animal meats, cheeses, and dairy products, appropriate calorie intake Discussed ideal weight for height Will follow up in 3 months  Hypogonadism - continue replacement therapy, check testosterone levels as needed.  - check today per patient request  Low TSH Has had biopsy 7+ years ago, reports not available, will defer on uptake scan pending receipt of reports  Vitamin D Def At goal at last visit; continue supplementation to maintain goal of 70-100 Defer Vit D level  Situational anxiety Gets anxious prior to work physical - will prescribe xanax 3 tabs to take temporarily.   Continue diet and meds as discussed. Further disposition pending results of labs. Discussed med's effects and SE's.   Over 30 minutes of exam, counseling, chart review, and critical decision making was performed.   Future Appointments  Date Time Provider Department Center  08/14/2018  3:00 PM [name], PA-C [facility] None    ----------------------------------------------------------------------------------------------------------------------  HPI 57 y.o. male  presents for 3 month follow up on hypertension, cholesterol, glucose management, testosterone  deficiency, weight and vitamin D deficiency.   BMI is Body mass index is 27.2 kg/m., he has been working on diet and exercise, actively dieting, he is stretching in AM and may walk in the afternoons, 1.5 miles 3-4 days a week.  Wt Readings from Last 3 Encounters:  05/22/18 166 lb (75.3 kg)  02/05/18 168 lb 12.8 oz (76.6 kg)  08/02/17 170 lb (77.1 kg)   His blood pressure has been controlled at home, today their BP is BP: 120/82  He does workout. He denies chest pain, shortness of breath, dizziness.    He is on cholesterol medication (rosuvastatin 20 mg daily, no recent dose changes) and denies myalgias. His cholesterol is not at goal. The cholesterol last visit was:   Lab Results  Component Value Date   CHOL 150 02/05/2018   HDL 40 (L) 02/05/2018   LDLCALC 90 02/05/2018   TRIG 106 02/05/2018   CHOLHDL 3.8 02/05/2018    He has been working on diet and exercise for glucose management/insulin resistance, and denies foot ulcerations, increased appetite, nausea, paresthesia of the feet, polydipsia, polyuria, visual disturbances, vomiting and weight loss. Last A1C in the office was:  Lab Results  Component Value Date   HGBA1C 5.5 08/02/2017   Patient is on Vitamin D supplement.   Lab Results  Component Value Date   VD25OH 2 08/02/2017     He is not on thyroid medication, but has been noted to be mildly hyperthyroid at last 2 checks:  Lab Results  Component Value Date   TSH 0.25 (L) 03/12/2018  Today he reports hx of biopsy of thyroid, ? Nodules when he was up in [place] prior to moving, 7+ years ago, will request reports and biopsy results.   He has a  history of testosterone deficiency and is on testosterone replacement. He states that the testosterone helps with his energy, libido, muscle mass. Lab Results  Component Value Date   TESTOSTERONE 974 (H) 02/05/2018     Current Medications:  Current Outpatient Medications on File Prior to Visit  Medication Sig  . ANDROGEL PUMP 20.25  MG/ACT (1.62%) GEL APPLY 2 PUMPS TO THE SKIN DAILY (Patient taking differently: APPLY 1 PUMP TO THE SKIN DAILY)  . bisoprolol-hydrochlorothiazide (ZIAC) 5-6.25 MG tablet Take 1 tablet daily for BP  . Cholecalciferol (VITAMIN D3) 5000 units CAPS Take 2 capsules by mouth daily.  . cyclobenzaprine (FLEXERIL) 5 MG tablet Take 1 tablet (5 mg total) by mouth 3 (three) times daily as needed for muscle spasms.  Marland Kitchen dexamethasone (DECADRON) 0.5 MG/5ML solution Take 0.5 mg by mouth 4 (four) times daily as needed. Oral rinse for kanker sores as needed  . HYDROcodone-acetaminophen (NORCO) 5-325 MG tablet Take 1 tablet by mouth every 4 (four) hours as needed for moderate pain.  Marland Kitchen lisinopril (PRINIVIL,ZESTRIL) 10 MG tablet TAKE 1 TABLET BY MOUTH DAILY  . Multiple Vitamins-Minerals (MULTIVITAMIN PO) Take by mouth.  . Omega 3-6-9 Fatty Acids (OMEGA 3-6-9 COMPLEX PO) Take by mouth every evening.   . rosuvastatin (CRESTOR) 20 MG tablet TAKE 1 TABLET BY MOUTH DAILY. GENERIC EQUIVALENT FOR CRESTOR  . sildenafil (VIAGRA) 100 MG tablet 1/2-1 pill as needed 1 hour before intercourse, can get with good RX from [facility]  . Zinc 50 MG TABS Take 1 tablet by mouth every evening.   No current facility-administered medications on file prior to visit.      Allergies: No Known Allergies   Medical History:  Past Medical History:  Diagnosis Date  . DDD (degenerative disc disease), lumbar   . Hyperlipidemia, mixed   . Hypertension   . Hypogonadism male   . Penile wart   . Vitamin D deficiency   . Wears contact lenses    Family history- Reviewed and unchanged Social history- Reviewed and unchanged   Review of Systems:  Review of Systems  Constitutional: Negative for malaise/fatigue and weight loss.  HENT: Negative for hearing loss and tinnitus.   Eyes: Negative for blurred vision and double vision.  Respiratory: Negative for cough, shortness of breath and wheezing.   Cardiovascular: Negative for chest pain,  palpitations, orthopnea, claudication and leg swelling.  Gastrointestinal: Negative for abdominal pain, blood in stool, constipation, diarrhea, heartburn, melena, nausea and vomiting.  Genitourinary: Negative.   Musculoskeletal: Negative for joint pain and myalgias.  Skin: Negative for rash.  Neurological: Negative for dizziness, tingling, sensory change, weakness and headaches.  Endo/Heme/Allergies: Negative for polydipsia.  Psychiatric/Behavioral: Negative.   All other systems reviewed and are negative.     Physical Exam: BP 120/82   Pulse 65   Temp (!) 97.3 F (36.3 C)   Ht 5' 5.5" (1.664 m)   Wt 166 lb (75.3 kg)   SpO2 98%   BMI 27.20 kg/m  Wt Readings from Last 3 Encounters:  05/22/18 166 lb (75.3 kg)  02/05/18 168 lb 12.8 oz (76.6 kg)  08/02/17 170 lb (77.1 kg)   General Appearance: Well nourished, in no apparent distress. Eyes: PERRLA, EOMs, conjunctiva no swelling or erythema Sinuses: No Frontal/maxillary tenderness ENT/Mouth: Ext aud canals clear, TMs without erythema, bulging. No erythema, swelling, or exudate on post pharynx.  Tonsils not swollen or erythematous. Hearing normal.  Neck: Supple, thyroid normal.  Respiratory: Respiratory effort normal, BS equal bilaterally without rales, rhonchi,  wheezing or stridor.  Cardio: RRR with no MRGs. Brisk peripheral pulses without edema.  Abdomen: Soft, + BS.  Non tender, no guarding, rebound, hernias, masses. Lymphatics: Non tender without lymphadenopathy.  Musculoskeletal: Full ROM, 5/5 strength, Normal gait Skin: Warm, dry without rashes, lesions, ecchymosis.  Neuro: Cranial nerves intact. No cerebellar symptoms.  Psych: Awake and oriented X 3, normal affect, Insight and Judgment appropriate.    Dan Maker, NP 8:58 AM [facility]

## 2018-05-22 ENCOUNTER — Ambulatory Visit: Payer: BLUE CROSS/BLUE SHIELD | Admitting: Adult Health

## 2018-05-22 ENCOUNTER — Encounter: Payer: Self-pay | Admitting: Adult Health

## 2018-05-22 VITALS — BP 120/82 | HR 65 | Temp 97.3°F | Ht 65.5 in | Wt 166.0 lb

## 2018-05-22 DIAGNOSIS — E349 Endocrine disorder, unspecified: Secondary | ICD-10-CM

## 2018-05-22 DIAGNOSIS — E559 Vitamin D deficiency, unspecified: Secondary | ICD-10-CM | POA: Diagnosis not present

## 2018-05-22 DIAGNOSIS — E88819 Insulin resistance, unspecified: Secondary | ICD-10-CM

## 2018-05-22 DIAGNOSIS — I10 Essential (primary) hypertension: Secondary | ICD-10-CM | POA: Diagnosis not present

## 2018-05-22 DIAGNOSIS — Z79899 Other long term (current) drug therapy: Secondary | ICD-10-CM

## 2018-05-22 DIAGNOSIS — E8881 Metabolic syndrome: Secondary | ICD-10-CM | POA: Diagnosis not present

## 2018-05-22 DIAGNOSIS — N182 Chronic kidney disease, stage 2 (mild): Secondary | ICD-10-CM | POA: Diagnosis not present

## 2018-05-22 DIAGNOSIS — E663 Overweight: Secondary | ICD-10-CM

## 2018-05-22 DIAGNOSIS — R7989 Other specified abnormal findings of blood chemistry: Secondary | ICD-10-CM

## 2018-05-22 DIAGNOSIS — E782 Mixed hyperlipidemia: Secondary | ICD-10-CM

## 2018-05-22 DIAGNOSIS — R7309 Other abnormal glucose: Secondary | ICD-10-CM

## 2018-05-22 MED ORDER — ALPRAZOLAM 0.5 MG PO TABS: ORAL_TABLET | ORAL | 0 refills | Status: DC

## 2018-05-22 NOTE — Patient Instructions (Signed)
Know what a healthy weight is for you (roughly BMI <25) and aim to maintain this  Aim for 7+ servings of fruits and vegetables daily  65-80+ fluid ounces of water or unsweet tea for healthy kidneys  Limit to max 1 drink of alcohol per day; avoid smoking/tobacco  Limit animal fats in diet for cholesterol and heart health - choose grass fed whenever available  Avoid highly processed foods, and foods high in saturated/trans fats  Aim for low stress - take time to unwind and care for your mental health  Aim for 150 min of moderate intensity exercise weekly for heart health, and weights twice weekly for bone health  Aim for 7-9 hours of sleep daily       When it comes to diets, agreement about the perfect plan isn't easy to find, even among the experts. Experts at the [employer] developed an idea known as the Healthy Eating Plate. Just imagine a plate divided into logical, healthy portions.  The emphasis is on diet quality:  Load up on vegetables and fruits - one-half of your plate: Aim for color and variety, and remember that potatoes don't count.  Go for whole grains - one-quarter of your plate: Whole wheat, barley, wheat berries, quinoa, oats, brown rice, and foods made with them. If you want pasta, go with whole wheat pasta.  Protein power - one-quarter of your plate: Fish, chicken, beans, and nuts are all healthy, versatile protein sources. Limit red meat.  The diet, however, does go beyond the plate, offering a few other suggestions.  Use healthy plant oils, such as olive, canola, soy, corn, sunflower and peanut. Check the labels, and avoid partially hydrogenated oil, which have unhealthy trans fats.  If you're thirsty, drink water. Coffee and tea are good in moderation, but skip sugary drinks and limit milk and dairy products to one or two daily servings.  The type of carbohydrate in the diet is more important than the amount. Some sources of  carbohydrates, such as vegetables, fruits, whole grains, and beans-are healthier than others.  Finally, stay active.     

## 2018-05-23 LAB — LIPID PANEL
CHOLESTEROL: 153 mg/dL (ref <200)
HDL: 38 mg/dL — ABNORMAL LOW (ref >40)
LDL CHOLESTEROL (CALC): 92 mg/dL
Non-HDL Cholesterol (Calc): 115 mg/dL (calc) (ref <130)
TRIGLYCERIDES: 125 mg/dL (ref <150)
Total CHOL/HDL Ratio: 4 (calc) (ref <5.0)

## 2018-05-23 LAB — COMPLETE METABOLIC PANEL WITH GFR
AG Ratio: 2.5 (calc) (ref 1.0–2.5)
ALKALINE PHOSPHATASE (APISO): 53 U/L (ref 40–115)
ALT: 30 U/L (ref 9–46)
AST: 21 U/L (ref 10–35)
Albumin: 4.8 g/dL (ref 3.6–5.1)
BUN: 17 mg/dL (ref 7–25)
CALCIUM: 10.4 mg/dL — AB (ref 8.6–10.3)
CHLORIDE: 102 mmol/L (ref 98–110)
CO2: 30 mmol/L (ref 20–32)
CREATININE: 1.06 mg/dL (ref 0.70–1.33)
GFR, EST NON AFRICAN AMERICAN: 76 mL/min/{1.73_m2} (ref >=60)
GFR, Est African American: 89 mL/min/{1.73_m2} (ref >=60)
GLUCOSE: 91 mg/dL (ref 65–99)
Globulin: 1.9 g/dL (calc) (ref 1.9–3.7)
Potassium: 5.2 mmol/L (ref 3.5–5.3)
Sodium: 140 mmol/L (ref 135–146)
Total Bilirubin: 0.7 mg/dL (ref 0.2–1.2)
Total Protein: 6.7 g/dL (ref 6.1–8.1)

## 2018-05-23 LAB — CBC WITH DIFFERENTIAL/PLATELET
BASOS PCT: 0.6 %
Basophils Absolute: 53 cells/uL (ref 0–200)
Eosinophils Absolute: 238 cells/uL (ref 15–500)
Eosinophils Relative: 2.7 %
HEMATOCRIT: 47.8 % (ref 38.5–50.0)
Hemoglobin: 16.4 g/dL (ref 13.2–17.1)
LYMPHS ABS: 2622 {cells}/uL (ref 850–3900)
MCH: 31.2 pg (ref 27.0–33.0)
MCHC: 34.3 g/dL (ref 32.0–36.0)
MCV: 90.9 fL (ref 80.0–100.0)
MPV: 10.6 fL (ref 7.5–12.5)
Monocytes Relative: 10.6 %
Neutro Abs: 4954 cells/uL (ref 1500–7800)
Neutrophils Relative %: 56.3 %
PLATELETS: 246 10*3/uL (ref 140–400)
RBC: 5.26 10*6/uL (ref 4.20–5.80)
RDW: 12.3 % (ref 11.0–15.0)
TOTAL LYMPHOCYTE: 29.8 %
WBC: 8.8 10*3/uL (ref 3.8–10.8)
WBCMIX: 933 {cells}/uL (ref 200–950)

## 2018-05-23 LAB — TSH: TSH: 0.32 mIU/L — ABNORMAL LOW (ref 0.40–4.50)

## 2018-06-05 ENCOUNTER — Other Ambulatory Visit: Payer: Self-pay | Admitting: Adult Health

## 2018-06-05 MED ORDER — SERTRALINE HCL 50 MG PO TABS: 50.0000 mg | ORAL_TABLET | Freq: Every day | ORAL | 1 refills | Status: DC

## 2018-06-05 NOTE — Progress Notes (Signed)
Patient reports mild symptoms of anxiety; discussed we would like to avoid daily use of benzo if possible, recommended low dose SSRI which he is agreeable to try. Zoloft 50 mg sent in. Discussed ideal trial period of 8-12 weeks, follow up as scheduled in January, or sooner if needed.

## 2018-07-01 ENCOUNTER — Other Ambulatory Visit: Payer: Self-pay | Admitting: Internal Medicine

## 2018-08-13 NOTE — Progress Notes (Signed)
Complete Physical  Assessment and Plan:  Essential hypertension - continue medications, DASH diet, exercise and monitor at home. Call if greater than 130/80.  -     CBC with Differential/Platelet -     BASIC METABOLIC PANEL WITH GFR -     Hepatic function panel -     TSH -     Urinalysis, Routine w reflex microscopic -     Microalbumin / creatinine urine ratio  DDD (degenerative disc disease), lumbar Suggest PT, declines at this time  Stage 1 chronic kidney disease Increase fluids, avoid NSAIDS, monitor sugars, will monitor -     BASIC METABOLIC PANEL WITH GFR  Hyperlipidemia, mixed -continue medications, check lipids, decrease fatty foods, increase activity.  -     Lipid panel  Testosterone deficiency on 1 pump and the zinc -     Testosterone  Medication management -     Magnesium  HSV-1 (herpes simplex virus 1) infection Monitor symptoms  Insomnia, unspecified type Insomnia- good sleep hygiene discussed, increase day time activity, try melatonin or benadryl if this does not help we will call in sleep medication.   Routine general medical examination at a health care facility 1 year  Vitamin D deficiency -     VITAMIN D 25 Hydroxy (Vit-D Deficiency, Fractures)  OSA (obstructive sleep apnea) Sleep apnea- continue CPAP, CPAP is helping with daytime fatigue, weight loss still advised.   Erectile dysfunction, unspecified erectile dysfunction type -     sildenafil (VIAGRA) 100 MG tablet; 1/2-1 pill as needed 1 hour before intercourse, can get with good RX from [facility]  Low TSH level -     TSH - will request papers - no symptoms will monitor  Anxiety/depression -     sertraline (ZOLOFT) 100 MG tablet; Take 1 tablet (100 mg total) by mouth daily.   Discussed med's effects and SE's. Screening labs and tests as requested with regular follow-up as recommended. Future Appointments  Date Time Provider Department Center  08/21/2019  3:00 PM Philip Mulling, PA-C  [facility] None    HPI Patient presents for a complete physical.   Work has been stressful, so he was started on zoloft 50mg , would like a higher number to try, having some sexual side effects but would like to stay on it due to work stress and it is helping.   Patient has been monitored for hyperthyroidism. Had [place] nodule and biopsy that was negative, has requested to have records sent.  Lab Results  Component Value Date   TSH 0.32 (L) 05/22/2018   His blood pressure has been controlled at home, today their BP is BP: 114/72 He does workout. He denies chest pain, shortness of breath, dizziness.  He reports that he is doing around 25 minutes of elliptical 3 days per week.     He is on cholesterol medication and denies myalgias. His cholesterol is at goal. The cholesterol last visit was:   Lab Results  Component Value Date   CHOL 153 05/22/2018   HDL 38 (L) 05/22/2018   LDLCALC 92 05/22/2018   TRIG 125 05/22/2018   CHOLHDL 4.0 05/22/2018   . Last A1C in the office was:  Lab Results  Component Value Date   HGBA1C 5.5 08/02/2017   Patient is on Vitamin D supplement, doubled dose last visit, on 10,000 a day.   Lab Results  Component Value Date   VD25OH 64 08/02/2017   He has a history of testosterone deficiency and is on testosterone  replacement. He states that the testosterone helps with his energy, libido, muscle mass. Lab Results  Component Value Date   TESTOSTERONE 974 (H) 02/05/2018   BMI is Body mass index is 26.99 kg/m., he is working on diet and exercise. Wt Readings from Last 3 Encounters:  08/14/18 167 lb 3.2 oz (75.8 kg)  05/22/18 166 lb (75.3 kg)  02/05/18 168 lb 12.8 oz (76.6 kg)      Current Medications:  Current Outpatient Medications on File Prior to Visit  Medication Sig Dispense Refill  . ALPRAZolam (XANAX) 0.5 MG tablet Take 1 30 min prior to OV. 3 tablet 0  . ANDROGEL PUMP 20.25 MG/ACT (1.62%) GEL APPLY 2 PUMPS TO THE SKIN DAILY (Patient taking  differently: APPLY 1 PUMP TO THE SKIN DAILY) 225 g 1  . bisoprolol-hydrochlorothiazide (ZIAC) 5-6.25 MG tablet Take 1 tablet daily for BP 90 tablet 1  . Cholecalciferol (VITAMIN D3) 5000 units CAPS Take 2 capsules by mouth daily.    . cyclobenzaprine (FLEXERIL) 5 MG tablet Take 1 tablet (5 mg total) by mouth 3 (three) times daily as needed for muscle spasms. 60 tablet 0  . dexamethasone (DECADRON) 0.5 MG/5ML solution Take 0.5 mg by mouth 4 (four) times daily as needed. Oral rinse for kanker sores as needed    . lisinopril (PRINIVIL,ZESTRIL) 10 MG tablet TAKE 1 TABLET BY MOUTH DAILY 90 tablet 0  . Multiple Vitamins-Minerals (MULTIVITAMIN PO) Take by mouth.    . Omega 3-6-9 Fatty Acids (OMEGA 3-6-9 COMPLEX PO) Take by mouth every evening.     . rosuvastatin (CRESTOR) 20 MG tablet TAKE 1 TABLET BY MOUTH DAILY. GENERIC EQUIVALENT FOR CRESTOR 90 tablet 0  . sildenafil (VIAGRA) 100 MG tablet 1/2-1 pill as needed 1 hour before intercourse, [facility] 10 tablet 2  . Zinc 50 MG TABS Take 1 tablet by mouth every evening.    . [DISCONTINUED] sertraline (ZOLOFT) 50 MG tablet Take 1 tablet (50 mg total) by mouth daily. 90 tablet 1   No current facility-administered medications on file prior to visit.     Health Maintenance:  Immunization History  Administered Date(s) Administered  . Influenza Inj Mdck Quad Pf 05/08/2018  . Tdap 05/04/2016   Tetanus: 05/04/16 Flu vaccine:  2018  Colonoscopy: Due in 2019 for 5 year recheck, first degree relative with colon cancer  Patient Care Team: Lucky CowboyMcKeown, William, MD as PCP - General (Internal Medicine) Sharrell KuMedoff, Jeffrey, MD as Consulting Physician (Gastroenterology)  Medical History:  Past Medical History:  Diagnosis Date  . DDD (degenerative disc disease), lumbar   . Hyperlipidemia, mixed   . Hypertension   . Hypogonadism male   . Penile wart   . Vitamin D deficiency   . Wears contact lenses    Allergies No Known  Allergies  SURGICAL HISTORY He  has a past surgical history that includes Lumbar microdiscectomy (12/29/2004); Inguinal hernia repair (Left, 2012  approx.); Penile biopsy (N/A, 02/27/2017); and Co2 laser application (02/27/2017).   FAMILY HISTORY His family history includes Cancer (age of onset: 4370) in his father; Diabetes in his brother; Hypotension in his mother.   SOCIAL HISTORY He  reports that he has never smoked. He has never used smokeless tobacco. He reports current alcohol use. He reports that he does not use drugs. Very rare alcohol use, 2-3 x a month  Review of Systems:  Review of Systems  Constitutional: Negative for chills, fever and malaise/fatigue.  HENT: Negative for congestion, ear  pain and sore throat.   Eyes: Negative.   Respiratory: Negative for cough, shortness of breath and wheezing.   Cardiovascular: Negative for chest pain, palpitations and leg swelling.  Gastrointestinal: Negative for abdominal pain, blood in stool, constipation, diarrhea, heartburn and melena.  Genitourinary: Negative.   Skin: Negative.   Neurological: Negative for dizziness, sensory change, loss of consciousness and headaches.  Psychiatric/Behavioral: Negative for depression. The patient is not nervous/anxious and does not have insomnia.     Physical Exam: Estimated body mass index is 26.99 kg/m as calculated from the following:   Height as of this encounter: 5\' 6"  (1.676 m).   Weight as of this encounter: 167 lb 3.2 oz (75.8 kg). BP 114/72   Pulse 64   Temp 97.6 F (36.4 C)   Ht 5\' 6"  (1.676 m)   Wt 167 lb 3.2 oz (75.8 kg)   SpO2 94%   BMI 26.99 kg/m   General Appearance: Well nourished, in no apparent distress. Alert, attentive. Eyes: PERRLA, EOMs, conjunctiva no swelling or erythema ENT/Mouth: Ear canals clear bilaterally with no erythema, swelling, discharge.  TMs normal bilaterally with no erythema, bulging, or retractions.  Oropharynx clear and moist with no exudate, swelling,  or erythema.  Dentition normal.   Neck: Supple, thyroid normal. No bruits, JVD, cervical adenopathy Respiratory: Respiratory effort normal, BS equal bilaterally without rales, rhonchi, wheezing or stridor.  Cardio: RRR without murmurs, rubs or gallops. Brisk peripheral pulses without edema. No bruits in the bilateral carotids.   Chest: symmetric, with normal excursions Abdomen: Soft, nontender, no guarding, rebound, hernias, masses, or organomegaly. Musculoskeletal: Full ROM all peripheral extremities,5/5 strength, and normal gait.  Skin: Warm, dry without rashes, lesions, ecchymosis. Neuro: A&Ox3, Cranial nerves intact, reflexes equal bilaterally. Normal muscle tone, no cerebellar symptoms. Sensation intact.  Psych: Normal affect, Insight and Judgment appropriate.   EKG: defer this age  Over 40 minutes of exam, counseling, chart review and critical decision making was performed  Philip Montgomery 3:19 PM [facility]

## 2018-08-14 ENCOUNTER — Encounter: Payer: Self-pay | Admitting: Physician Assistant

## 2018-08-14 ENCOUNTER — Ambulatory Visit (INDEPENDENT_AMBULATORY_CARE_PROVIDER_SITE_OTHER): Payer: BLUE CROSS/BLUE SHIELD | Admitting: Physician Assistant

## 2018-08-14 VITALS — BP 114/72 | HR 64 | Temp 97.6°F | Ht 66.0 in | Wt 167.2 lb

## 2018-08-14 DIAGNOSIS — N182 Chronic kidney disease, stage 2 (mild): Secondary | ICD-10-CM

## 2018-08-14 DIAGNOSIS — E8881 Metabolic syndrome: Secondary | ICD-10-CM

## 2018-08-14 DIAGNOSIS — Z79899 Other long term (current) drug therapy: Secondary | ICD-10-CM | POA: Diagnosis not present

## 2018-08-14 DIAGNOSIS — E349 Endocrine disorder, unspecified: Secondary | ICD-10-CM

## 2018-08-14 DIAGNOSIS — E559 Vitamin D deficiency, unspecified: Secondary | ICD-10-CM | POA: Diagnosis not present

## 2018-08-14 DIAGNOSIS — Z1329 Encounter for screening for other suspected endocrine disorder: Secondary | ICD-10-CM

## 2018-08-14 DIAGNOSIS — E663 Overweight: Secondary | ICD-10-CM

## 2018-08-14 DIAGNOSIS — Z13220 Encounter for screening for lipoid disorders: Secondary | ICD-10-CM

## 2018-08-14 DIAGNOSIS — G4700 Insomnia, unspecified: Secondary | ICD-10-CM

## 2018-08-14 DIAGNOSIS — E782 Mixed hyperlipidemia: Secondary | ICD-10-CM

## 2018-08-14 DIAGNOSIS — Z1389 Encounter for screening for other disorder: Secondary | ICD-10-CM | POA: Diagnosis not present

## 2018-08-14 DIAGNOSIS — I10 Essential (primary) hypertension: Secondary | ICD-10-CM | POA: Diagnosis not present

## 2018-08-14 DIAGNOSIS — Z136 Encounter for screening for cardiovascular disorders: Secondary | ICD-10-CM

## 2018-08-14 DIAGNOSIS — Z0000 Encounter for general adult medical examination without abnormal findings: Secondary | ICD-10-CM

## 2018-08-14 DIAGNOSIS — B009 Herpesviral infection, unspecified: Secondary | ICD-10-CM

## 2018-08-14 DIAGNOSIS — M5136 Other intervertebral disc degeneration, lumbar region: Secondary | ICD-10-CM

## 2018-08-14 DIAGNOSIS — R7989 Other specified abnormal findings of blood chemistry: Secondary | ICD-10-CM

## 2018-08-14 DIAGNOSIS — R7309 Other abnormal glucose: Secondary | ICD-10-CM

## 2018-08-14 MED ORDER — SERTRALINE HCL 100 MG PO TABS: 100.0000 mg | ORAL_TABLET | Freq: Every day | ORAL | 1 refills | Status: DC

## 2018-08-14 NOTE — Patient Instructions (Addendum)
HYPERTENSION INFORMATION  Monitor your blood pressure at home, please keep a record and bring that in with you to your next office visit.   Go to the ER if any CP, SOB, nausea, dizziness, severe HA, changes vision/speech  Due to a recent study, SPRINT, we have changed our goal for the systolic or top blood pressure number. Ideally we want your top number at 120.  In the [facility] Trial, 5000 people were randomized to a goal BP of 120 and 5000 people were randomized to a goal BP of less than 140. The patients with the goal BP at 120 had LESS DEMENTIA, LESS HEART ATTACKS, AND LESS STROKES, AS WELL AS OVERALL DECREASED MORTALITY OR DEATH RATE.   If you are willing, our goal BP is the top number of 120.  Your most recent BP: BP: 114/72   Take your medications faithfully as instructed. Maintain a healthy weight. Get at least 150 minutes of aerobic exercise per week. Minimize salt intake. Minimize alcohol intake  DASH Eating Plan DASH stands for "Dietary Approaches to Stop Hypertension." The DASH eating plan is a healthy eating plan that has been shown to reduce high blood pressure (hypertension). Additional health benefits may include reducing the risk of type 2 diabetes mellitus, heart disease, and stroke. The DASH eating plan may also help with weight loss. WHAT DO I NEED TO KNOW ABOUT THE DASH EATING PLAN? For the DASH eating plan, you will follow these general guidelines:  Choose foods with a percent daily value for sodium of less than 5% (as listed on the food label).  Use salt-free seasonings or herbs instead of table salt or sea salt.  Check with your health care provider or pharmacist before using salt substitutes.  Eat lower-sodium products, often labeled as "lower sodium" or "no salt added."  Eat fresh foods.  Eat more vegetables, fruits, and low-fat dairy products.  Choose whole grains. Look for the word "whole" as the first word in the ingredient list.  Choose fish and  skinless chicken or [place] more often than red meat. Limit fish, poultry, and meat to 6 oz (170 g) each day.  Limit sweets, desserts, sugars, and sugary drinks.  Choose heart-healthy fats.  Limit cheese to 1 oz (28 g) per day.  Eat more home-cooked food and less restaurant, buffet, and fast food.  Limit fried foods.  Cook foods using methods other than frying.  Limit canned vegetables. If you do use them, rinse them well to decrease the sodium.  When eating at a restaurant, ask that your food be prepared with less salt, or no salt if possible. WHAT FOODS CAN I EAT? Seek help from a dietitian for individual calorie needs. Grains Whole grain or whole wheat bread. Brown rice. Whole grain or whole wheat pasta. Quinoa, bulgur, and whole grain cereals. Low-sodium cereals. Corn or whole wheat flour tortillas. Whole grain cornbread. Whole grain crackers. Low-sodium crackers. Vegetables Fresh or frozen vegetables (raw, steamed, roasted, or grilled). Low-sodium or reduced-sodium tomato and vegetable juices. Low-sodium or reduced-sodium tomato sauce and paste. Low-sodium or reduced-sodium canned vegetables.  Fruits All fresh, canned (in natural juice), or frozen fruits. Meat and Other Protein Products Ground beef (85% or leaner), grass-fed beef, or beef trimmed of fat. Skinless chicken or [place]. Ground chicken or [place]. Pork trimmed of fat. All fish and seafood. Eggs. Dried beans, peas, or lentils. Unsalted nuts and seeds. Unsalted canned beans. Dairy Low-fat dairy products, such as skim or 1% milk, 2% or reduced-fat cheeses,  low-fat ricotta or cottage cheese, or plain low-fat yogurt. Low-sodium or reduced-sodium cheeses. Fats and Oils Tub margarines without trans fats. Light or reduced-fat mayonnaise and salad dressings (reduced sodium). Avocado. Safflower, olive, or canola oils. Natural peanut or almond butter. Other Unsalted popcorn and pretzels. The items listed above may not be a  complete list of recommended foods or beverages. Contact your dietitian for more options. WHAT FOODS ARE NOT RECOMMENDED? Grains White bread. White pasta. White rice. Refined cornbread. Bagels and croissants. Crackers that contain trans fat. Vegetables Creamed or fried vegetables. Vegetables in a cheese sauce. Regular canned vegetables. Regular canned tomato sauce and paste. Regular tomato and vegetable juices. Fruits Dried fruits. Canned fruit in light or heavy syrup. Fruit juice. Meat and Other Protein Products Fatty cuts of meat. Ribs, chicken wings, bacon, sausage, bologna, salami, chitterlings, fatback, hot dogs, bratwurst, and packaged luncheon meats. Salted nuts and seeds. Canned beans with salt. Dairy Whole or 2% milk, cream, half-and-half, and cream cheese. Whole-fat or sweetened yogurt. Full-fat cheeses or blue cheese. Nondairy creamers and whipped toppings. Processed cheese, cheese spreads, or cheese curds. Condiments Onion and garlic salt, seasoned salt, table salt, and sea salt. Canned and packaged gravies. Worcestershire sauce. Tartar sauce. Barbecue sauce. Teriyaki sauce. Soy sauce, including reduced sodium. Steak sauce. Fish sauce. Oyster sauce. Cocktail sauce. Horseradish. Ketchup and mustard. Meat flavorings and tenderizers. Bouillon cubes. Hot sauce. Tabasco sauce. Marinades. Taco seasonings. Relishes. Fats and Oils Butter, stick margarine, lard, shortening, ghee, and bacon fat. Coconut, palm kernel, or palm oils. Regular salad dressings. Other Pickles and olives. Salted popcorn and pretzels. The items listed above may not be a complete list of foods and beverages to avoid. Contact your dietitian for more information. WHERE CAN I FIND MORE INFORMATION? National Heart, Lung, and Blood Institute: CablePromo.itwww.nhlbi.nih.gov/health/health-topics/topics/dash/ Document Released: 07/07/2011 Document Revised: 12/02/2013 Document Reviewed: 05/22/2013 [facility] Patient Information 2015  [employer] [place], [place]. This information is not intended to replace advice given to you by your health care provider. Make sure you discuss any questions you have with your health care provider.    Can do zinc 40-50 mg a day Can try shake that is whey protein, almond milk, avocado oil, and spinach and strawberries in the morning  9 Ways to Naturally Increase Testosterone Levels  1.   Lose Weight If you're overweight, shedding the excess pounds may increase your testosterone levels, according to research presented at the Endocrine Society's 2012 meeting. Overweight men are more likely to have low testosterone levels to begin with, so this is an important trick to increase your body's testosterone production when you need it most.  2.   High-Intensity Exercise like Peak Fitness  Short intense exercise has a proven positive effect on increasing testosterone levels and preventing its decline. That's unlike aerobics or prolonged moderate exercise, which have shown to have negative or no effect on testosterone levels. Having a whey protein meal after exercise can further enhance the satiety/testosterone-boosting impact (hunger hormones cause the opposite effect on your testosterone and libido). Here's a summary of what a typical high-intensity Peak Fitness routine might look like: " Warm up for three minutes  " Exercise as hard and fast as you can for 30 seconds. You should feel like you couldn't possibly go on another few seconds  " Recover at a slow to moderate pace for 90 seconds  " Repeat the high intensity exercise and recovery 7 more times .  3.   Consume Plenty of Zinc The mineral zinc is important  for testosterone production, and supplementing your diet for as little as six weeks has been shown to cause a marked improvement in testosterone among men with low levels.1 Likewise, research has shown that restricting dietary sources of zinc leads to a significant decrease in testosterone, while zinc  supplementation increases it2 -- and even protects men from exercised-induced reductions in testosterone levels.3 It's estimated that up to 17 percent of adults over the age of 60 may have lower than recommended zinc intakes; even when dietary supplements were added in, an estimated 20-25 percent of older adults still had inadequate zinc intakes, according to a [employer] and Nutrition Examination Survey.4 Your diet is the best source of zinc; along with protein-rich foods like meats and fish, other good dietary sources of zinc include raw milk, raw cheese, beans, and yogurt or kefir made from raw milk. It can be difficult to obtain enough dietary zinc if you're a vegetarian, and also for meat-eaters as well, largely because of conventional farming methods that rely heavily on chemical fertilizers and pesticides. These chemicals deplete the soil of nutrients ... nutrients like zinc that must be absorbed by plants in order to be passed on to you. In many cases, you may further deplete the nutrients in your food by the way you prepare it. For most food, cooking it will drastically reduce its levels of nutrients like zinc ... particularly over-cooking, which many people do. If you decide to use a zinc supplement, stick to a dosage of less than 40 mg a day, as this is the recommended adult upper limit. Taking too much zinc can interfere with your body's ability to absorb other minerals, especially copper, and may cause nausea as a side effect.  4.   Strength Training In addition to Peak Fitness, strength training is also known to boost testosterone levels, provided you are doing so intensely enough. When strength training to boost testosterone, you'll want to increase the weight and lower your number of reps, and then focus on exercises that work a large number of muscles, such as dead lifts or squats.  You can "turbo-charge" your weight training by going slower. By slowing down your movement, you're  actually turning it into a high-intensity exercise. Super Slow movement allows your muscle, at the microscopic level, to access the maximum number of cross-bridges between the protein filaments that produce movement in the muscle.   5.   Optimize Your Vitamin D Levels Vitamin D, a steroid hormone, is essential for the healthy development of the nucleus of the sperm cell, and helps maintain semen quality and sperm count. Vitamin D also increases levels of testosterone, which may boost libido. In one study, overweight men who were given vitamin D supplements had a significant increase in testosterone levels after one year.5   6.   Reduce Stress When you're under a lot of stress, your body releases high levels of the stress hormone cortisol. This hormone actually blocks the effects of testosterone,6 presumably because, from a biological standpoint, testosterone-associated behaviors (mating, competing, aggression) may have lowered your chances of survival in an emergency (hence, the "fight or flight" response is dominant, courtesy of cortisol).  7.   Limit or Eliminate Sugar from Your Diet Testosterone levels decrease after you eat sugar, which is likely because the sugar leads to a high insulin level, another factor leading to low testosterone.7 Based on USDA estimates, the average American consumes 12 teaspoons of sugar a day, which equates to about TWO TONS of sugar during  a lifetime.  8.   Eat Healthy Fats By healthy, this means not only mon- and polyunsaturated fats, like that found in avocadoes and nuts, but also saturated, as these are essential for building testosterone. Research shows that a diet with less than 40 percent of energy as fat (and that mainly from animal sources, i.e. saturated) lead to a decrease in testosterone levels.8 My personal diet is about 60-70 percent healthy fat, and other experts agree that the ideal diet includes somewhere between 50-70 percent fat.  It's important to  understand that your body requires saturated fats from animal and vegetable sources (such as meat, dairy, certain oils, and tropical plants like coconut) for optimal functioning, and if you neglect this important food group in favor of sugar, grains and other starchy carbs, your health and weight are almost guaranteed to suffer. Examples of healthy fats you can eat more of to give your testosterone levels a boost include: Olives and Olive oil  Coconuts and coconut oil Butter made from raw grass-fed organic milk Raw nuts, such as, almonds or pecans Organic pastured egg yolks Avocados Grass-fed meats Palm oil Unheated organic nut oils   9.   Boost Your Intake of Branch Chain Amino Acids (BCAA) from Foods Like Whey Protein Research suggests that BCAAs result in higher testosterone levels, particularly when taken along with resistance training.9 While BCAAs are available in supplement form, you'll find the highest concentrations of BCAAs like leucine in dairy products - especially quality cheeses and whey protein. Even when getting leucine from your natural food supply, it's often wasted or used as a building block instead of an anabolic agent. So to create the correct anabolic environment, you need to boost leucine consumption way beyond mere maintenance levels. That said, keep in mind that using leucine as a free form amino acid can be highly counterproductive as when free form amino acids are artificially administrated, they rapidly enter your circulation while disrupting insulin function, and impairing your body's glycemic control. Food-based leucine is really the ideal form that can benefit your muscles without side effects.    Hyperthyroidism  Hyperthyroidism is when the thyroid gland is too active (overactive). The thyroid gland is a small gland located in the lower front part of the neck, just in front of the windpipe (trachea). This gland makes hormones that help control how the body uses food for  energy (metabolism) as well as how the heart and brain function. These hormones also play a role in keeping your bones strong. When the thyroid is overactive, it produces too much of a hormone called thyroxine. What are the causes? This condition may be caused by:  Graves' disease. This is a disorder in which the body's disease-fighting system (immune system) attacks the thyroid gland. This is the most common cause.  Inflammation of the thyroid gland.  A tumor in the thyroid gland.  Use of certain medicines, including: ? Prescription thyroid hormone replacement. ? Herbal supplements that mimic thyroid hormones. ? Amiodarone therapy.  Solid or fluid-filled lumps within your thyroid gland (thyroid nodules).  Taking in a large amount of iodine from foods or medicines. What increases the risk? You are more likely to develop this condition if:  You are male.  You have a family history of thyroid conditions.  You smoke tobacco.  You use a medicine called lithium.  You take medicines that affect the immune system (immunosuppressants). What are the signs or symptoms? Symptoms of this condition include:  Nervousness.  Inability to  tolerate heat.  Unexplained weight loss.  Diarrhea.  Change in the texture of hair or skin.  Heart skipping beats or making extra beats.  Rapid heart rate.  Loss of menstruation.  Shaky hands.  Fatigue.  Restlessness.  Sleep problems.  Enlarged thyroid gland or a lump in the thyroid (nodule). You may also have symptoms of Graves' disease, which may include:  Protruding eyes.  Dry eyes.  Red or swollen eyes.  Problems with vision. How is this diagnosed? This condition may be diagnosed based on:  Your symptoms and medical history.  A physical exam.  Blood tests.  Thyroid ultrasound. This test involves using sound waves to produce images of the thyroid gland.  A thyroid scan. A radioactive substance is injected into a  vein, and images show how much iodine is present in the thyroid.  Radioactive iodine uptake test (RAIU). A small amount of radioactive iodine is given by mouth to see how much iodine the thyroid absorbs after a certain amount of time. How is this treated? Treatment depends on the cause and severity of the condition. Treatment may include:  Medicines to reduce the amount of thyroid hormone your body makes.  Radioactive iodine treatment (radioiodine therapy). This involves swallowing a small dose of radioactive iodine, in capsule or liquid form, to kill thyroid cells.  Surgery to remove part or all of your thyroid gland. You may need to take thyroid hormone replacement medicine for the rest of your life after thyroid surgery.  Medicines to help manage your symptoms. Follow these instructions at home:   Take over-the-counter and prescription medicines only as told by your health care provider.  Do not use any products that contain nicotine or tobacco, such as cigarettes and e-cigarettes. If you need help quitting, ask your health care provider.  Follow any instructions from your health care provider about diet. You may be instructed to limit foods that contain iodine.  Keep all follow-up visits as told by your health care provider. This is important. ? You will need to have blood tests regularly so that your health care provider can monitor your condition. Contact a health care provider if:  Your symptoms do not get better with treatment.  You have a fever.  You are taking thyroid hormone replacement medicine and you: ? Have symptoms of depression. ? Feel like you are tired all the time. ? Gain weight. Get help right away if:  You have chest pain.  You have decreased alertness or a change in your awareness.  You have abdominal pain.  You feel dizzy.  You have a rapid heartbeat.  You have an irregular heartbeat.  You have difficulty breathing. Summary  The thyroid gland  is a small gland located in the lower front part of the neck, just in front of the windpipe (trachea).  Hyperthyroidism is when the thyroid gland is too active (overactive) and produces too much of a hormone called thyroxine.  The most common cause is Graves' disease, a disorder in which your immune system attacks the thyroid gland.  Hyperthyroidism can cause various symptoms, such as unexplained weight loss, nervousness, inability to tolerate heat, or changes in your heartbeat.  Treatment may include medicine to reduce the amount of thyroid hormone your body makes, radioiodine therapy, surgery, or medicines to manage symptoms. This information is not intended to replace advice given to you by your health care provider. Make sure you discuss any questions you have with your health care provider. Document Released: 07/18/2005 Document  Revised: 06/28/2017 Document Reviewed: 06/28/2017 Elsevier Interactive Patient Education  [employer].

## 2018-08-15 LAB — COMPLETE METABOLIC PANEL WITH GFR
AG Ratio: 2.5 (calc) (ref 1.0–2.5)
ALBUMIN MSPROF: 4.8 g/dL (ref 3.6–5.1)
ALT: 27 U/L (ref 9–46)
AST: 22 U/L (ref 10–35)
Alkaline phosphatase (APISO): 65 U/L (ref 40–115)
BUN: 17 mg/dL (ref 7–25)
CALCIUM: 10.1 mg/dL (ref 8.6–10.3)
CO2: 31 mmol/L (ref 20–32)
CREATININE: 1.14 mg/dL (ref 0.70–1.33)
Chloride: 99 mmol/L (ref 98–110)
GFR, EST NON AFRICAN AMERICAN: 70 mL/min/{1.73_m2} (ref >=60)
GFR, Est African American: 81 mL/min/{1.73_m2} (ref >=60)
Globulin: 1.9 g/dL (calc) (ref 1.9–3.7)
Glucose, Bld: 67 mg/dL (ref 65–99)
Potassium: 4.4 mmol/L (ref 3.5–5.3)
Sodium: 139 mmol/L (ref 135–146)
Total Bilirubin: 0.7 mg/dL (ref 0.2–1.2)
Total Protein: 6.7 g/dL (ref 6.1–8.1)

## 2018-08-15 LAB — CBC WITH DIFFERENTIAL/PLATELET
Absolute Monocytes: 911 cells/uL (ref 200–950)
BASOS ABS: 64 {cells}/uL (ref 0–200)
Basophils Relative: 0.7 %
EOS ABS: 276 {cells}/uL (ref 15–500)
EOS PCT: 3 %
HEMATOCRIT: 48.4 % (ref 38.5–50.0)
Hemoglobin: 17.1 g/dL (ref 13.2–17.1)
LYMPHS ABS: 2723 {cells}/uL (ref 850–3900)
MCH: 32.6 pg (ref 27.0–33.0)
MCHC: 35.3 g/dL (ref 32.0–36.0)
MCV: 92.2 fL (ref 80.0–100.0)
MPV: 10.8 fL (ref 7.5–12.5)
Monocytes Relative: 9.9 %
NEUTROS PCT: 56.8 %
Neutro Abs: 5226 cells/uL (ref 1500–7800)
Platelets: 260 10*3/uL (ref 140–400)
RBC: 5.25 10*6/uL (ref 4.20–5.80)
RDW: 12.1 % (ref 11.0–15.0)
Total Lymphocyte: 29.6 %
WBC: 9.2 10*3/uL (ref 3.8–10.8)

## 2018-08-15 LAB — LIPID PANEL
CHOL/HDL RATIO: 5.2 (calc) — AB (ref <5.0)
Cholesterol: 206 mg/dL — ABNORMAL HIGH (ref <200)
HDL: 40 mg/dL — AB (ref >40)
LDL CHOLESTEROL (CALC): 110 mg/dL — AB
Non-HDL Cholesterol (Calc): 166 mg/dL (calc) — ABNORMAL HIGH (ref <130)
Triglycerides: 391 mg/dL — ABNORMAL HIGH (ref <150)

## 2018-08-15 LAB — TESTOSTERONE: Testosterone: 473 ng/dL (ref 250–827)

## 2018-08-15 LAB — MICROALBUMIN / CREATININE URINE RATIO
CREATININE, URINE: 108 mg/dL (ref 20–320)
Microalb Creat Ratio: 2 mcg/mg creat (ref <30)
Microalb, Ur: 0.2 mg/dL

## 2018-08-15 LAB — TSH: TSH: 1.01 mIU/L (ref 0.40–4.50)

## 2018-08-15 LAB — MAGNESIUM: Magnesium: 2.2 mg/dL (ref 1.5–2.5)

## 2018-08-22 ENCOUNTER — Other Ambulatory Visit: Payer: Self-pay | Admitting: Adult Health

## 2018-09-28 NOTE — Progress Notes (Deleted)
   Assessment and Plan:    HPI 58 y.o.male presents for follow up for medication change. He was increased to zoloft 100 mg for stress/anxiety.   Past Medical History:  Diagnosis Date  . DDD (degenerative disc disease), lumbar   . Hyperlipidemia, mixed   . Hypertension   . Hypogonadism male   . Penile wart   . Vitamin D deficiency   . Wears contact lenses      No Known Allergies  Current Outpatient Medications on File Prior to Visit  Medication Sig  . ALPRAZolam (XANAX) 0.5 MG tablet Take 1 30 min prior to OV.  Marland Kitchen ANDROGEL PUMP 20.25 MG/ACT (1.62%) GEL APPLY 2 PUMPS TO THE SKIN DAILY (Patient taking differently: APPLY 1 PUMP TO THE SKIN DAILY)  . bisoprolol-hydrochlorothiazide (ZIAC) 5-6.25 MG tablet Take 1 tablet daily for BP  . Cholecalciferol (VITAMIN D3) 5000 units CAPS Take 2 capsules by mouth daily.  . cyclobenzaprine (FLEXERIL) 5 MG tablet Take 1 tablet (5 mg total) by mouth 3 (three) times daily as needed for muscle spasms.  Marland Kitchen dexamethasone (DECADRON) 0.5 MG/5ML solution Take 0.5 mg by mouth 4 (four) times daily as needed. Oral rinse for kanker sores as needed  . lisinopril (PRINIVIL,ZESTRIL) 10 MG tablet TAKE 1 TABLET BY MOUTH DAILY  . Multiple Vitamins-Minerals (MULTIVITAMIN PO) Take by mouth.  . Omega 3-6-9 Fatty Acids (OMEGA 3-6-9 COMPLEX PO) Take by mouth every evening.   . rosuvastatin (CRESTOR) 20 MG tablet TAKE 1 TABLET BY MOUTH DAILY GENERIC EQUIVALENT FOR CRESTOR  . sertraline (ZOLOFT) 100 MG tablet Take 1 tablet (100 mg total) by mouth daily.  . sildenafil (VIAGRA) 100 MG tablet 1/2-1 pill as needed 1 hour before intercourse, can get with good RX from [employer]  . Zinc 50 MG TABS Take 1 tablet by mouth every evening.   No current facility-administered medications on file prior to visit.     ROS: all negative except above.   Physical Exam: There were no vitals filed for this visit. There were no vitals taken for this visit. General Appearance: Well  nourished, in no apparent distress. Eyes: PERRLA, EOMs, conjunctiva no swelling or erythema Sinuses: No Frontal/maxillary tenderness ENT/Mouth: Ext aud canals clear, TMs without erythema, bulging. No erythema, swelling, or exudate on post pharynx.  Tonsils not swollen or erythematous. Hearing normal.  Neck: Supple, thyroid normal.  Respiratory: Respiratory effort normal, BS equal bilaterally without rales, rhonchi, wheezing or stridor.  Cardio: RRR with no MRGs. Brisk peripheral pulses without edema.  Abdomen: Soft, + BS.  Non tender, no guarding, rebound, hernias, masses. Lymphatics: Non tender without lymphadenopathy.  Musculoskeletal: Full ROM, 5/5 strength, normal gait.  Skin: Warm, dry without rashes, lesions, ecchymosis.  Neuro: Cranial nerves intact. Normal muscle tone, no cerebellar symptoms. Sensation intact.  Psych: Awake and oriented X 3, normal affect, Insight and Judgment appropriate.     [name], PA-C 12:45 PM [facility]

## 2018-10-03 ENCOUNTER — Ambulatory Visit: Payer: Self-pay | Admitting: Physician Assistant

## 2018-10-20 ENCOUNTER — Other Ambulatory Visit: Payer: Self-pay | Admitting: Internal Medicine

## 2018-11-05 ENCOUNTER — Encounter (INDEPENDENT_AMBULATORY_CARE_PROVIDER_SITE_OTHER): Payer: BLUE CROSS/BLUE SHIELD

## 2018-11-05 DIAGNOSIS — L255 Unspecified contact dermatitis due to plants, except food: Secondary | ICD-10-CM

## 2018-11-05 MED ORDER — TRIAMCINOLONE ACETONIDE 0.1 % EX OINT: 1.0000 "application " | TOPICAL_OINTMENT | Freq: Two times a day (BID) | CUTANEOUS | 1 refills | Status: DC

## 2018-11-05 NOTE — Telephone Encounter (Signed)
Virtual Visit via Telephone Note  I connected with Philip Montgomery on 11/05/18 at 10:15 am by telephone and verified that I am speaking with the correct person using two identifiers.   I discussed the limitations, risks, security and privacy concerns of performing an evaluation and management service by telephone and the availability of in person appointments. I also discussed with the patient that there may be a patient responsible charge related to this service. The patient expressed understanding and agreed to proceed.   History of Present Illness:  58 y.o. male reports new itchy rash to bilateral forearms and ankles after working in the yard over the weekend. Is is concerned about possible poison Ivy. He denies history of this, but does endorse itchy rash with erythematous base, some blisters with clear discharge. He feels rash is mild. He has tried OTC hydrocortisone which does improve itching somewhat. He denies rash to face, hands or genitals. Denies any other symptoms including fever/chills, nausea/vomiting, dyspnea, cough, wheezing, dizziness, chest pain, palpitations.   Observations/Objective:  General : Well sounding patient in no apparent distress HEENT: no hoarseness, no cough for duration of visit Lungs: speaks in complete sentences, no audible wheezing, no apparent distress Neurological: alert, oriented x 3 Psychiatric: pleasant, judgement appropriate   Assessment and Plan:  Diagnoses and all orders for this visit:  Dermatitis due to plants, including poison ivy, sumac, and oak  Can use benadryl, calamine lotion, contact precautions discussed.  -     triamcinolone ointment (KENALOG) 0.1 %; Apply 1 application topically 2 (two) times daily.   Follow Up Instructions:   I discussed the assessment and treatment plan with the patient. The patient was provided an opportunity to ask questions and all were answered. The patient agreed with the plan and demonstrated an understanding  of the instructions.   The patient was advised to call back or seek an in-person evaluation if the symptoms worsen or if the condition fails to improve as anticipated.  I provided 15 minutes of non-face-to-face time during this encounter.   Dan Maker, NP

## 2018-11-10 ENCOUNTER — Other Ambulatory Visit: Payer: Self-pay | Admitting: Physician Assistant

## 2018-11-10 DIAGNOSIS — L255 Unspecified contact dermatitis due to plants, except food: Secondary | ICD-10-CM

## 2018-11-10 MED ORDER — PREDNISONE 20 MG PO TABS: ORAL_TABLET | ORAL | 0 refills | Status: DC

## 2018-11-22 DIAGNOSIS — F419 Anxiety disorder, unspecified: Secondary | ICD-10-CM | POA: Insufficient documentation

## 2018-11-22 NOTE — Progress Notes (Signed)
FOLLOW UP  Assessment and Plan:   Hypertension Well controlled with current medications  Monitor blood pressure at home; patient to call if consistently greater than 130/80 Continue DASH diet.   Reminder to go to the ER if any CP, SOB, nausea, dizziness, severe HA, changes vision/speech, left arm numbness and tingling and jaw pain.  Cholesterol Currently above goal; currently taking rosuvastatin 20 mg, previously well controlled on this dose Continue low cholesterol diet and exercise.  Check lipid panel.   Other abnormal gluocse Recent A1Cs at goal Discussed diet/exercise, weight management  Defer A1C; check CMP  Overweight Long discussion about weight loss, diet, and exercise Recommended diet heavy in fruits and veggies and low in animal meats, cheeses, and dairy products, appropriate calorie intake Discussed ideal weight for height Will follow up in 3 months  Hypogonadism - continue replacement therapy, check testosterone levels as needed.   Low TSH Has had biopsy 7+ years ago, reports not available, will defer on uptake scan pending receipt of reports and with normalized TSH Continue to monitor TSH at each visit  Vitamin D Def At goal at last visit; continue supplementation to maintain goal of 60-100 Defer Vit D level  Anxiety Well managed by current regimen; continue medications Stress management techniques discussed, increase water, good sleep hygiene discussed, increase exercise, and increase veggies.   Cyst to right thigh Schedule removal with Dr. Oneta RackMckeown; appears sebaceous cyst, non-inflamed  Continue diet and meds as discussed. Further disposition pending results of labs. Discussed med's effects and SE's.   Over 30 minutes of exam, counseling, chart review, and critical decision making was performed.   Future Appointments  Date Time Provider Department Center  08/21/2019  3:00 PM Quentin Mullingollier, Amanda, PA-C [facility] None     ----------------------------------------------------------------------------------------------------------------------  HPI 58 y.o. male  presents for 3 month follow up on hypertension, cholesterol, glucose management, testosterone deficiency, weight and vitamin D deficiency.   He has hx of anxiety/insomnia, has been prescribed zoloft 100 mg daily and feels well with this dose.  He has sebaceous cyst to R upper thigh present for at least 15 years, recently more enlarged and intermittently uncomfortable.    BMI is Body mass index is 26.31 kg/m., he has been working on diet and exercise, actively dieting, he is stretching in AM and may walk in the afternoons, 1.5 miles 3-4 days a week. He has been practicing intermittent fasting during the week. He reports 32 oz of water, 2-3 cups of coffee.  Wt Readings from Last 3 Encounters:  11/26/18 163 lb (73.9 kg)  08/14/18 167 lb 3.2 oz (75.8 kg)  05/22/18 166 lb (75.3 kg)   His blood pressure has been controlled at home, today their BP is BP: 102/68  He does workout. He denies chest pain, shortness of breath, dizziness.    He is on cholesterol medication (rosuvastatin 20 mg daily, no recent dose changes) and denies myalgias. His cholesterol is not at goal. The cholesterol last visit was:   Lab Results  Component Value Date   CHOL 206 (H) 08/14/2018   HDL 40 (L) 08/14/2018   LDLCALC 110 (H) 08/14/2018   TRIG 391 (H) 08/14/2018   CHOLHDL 5.2 (H) 08/14/2018    He has been working on diet and exercise for glucose management/insulin resistance, and denies foot ulcerations, increased appetite, nausea, paresthesia of the feet, polydipsia, polyuria, visual disturbances, vomiting and weight loss. Last A1C in the office was:  Lab Results  Component Value Date   HGBA1C 5.5 08/02/2017  Patient is on Vitamin D supplement and at goal at recent check:    Lab Results  Component Value Date   VD25OH 64 08/02/2017     He is not on thyroid medication,  last TSH normal but with recent hx of mild intermittent low TSH:  Lab Results  Component Value Date   TSH 1.01 08/14/2018  He reports hx of biopsy of thyroid, ? Nodules when he was up in [place] prior to moving, 7+ years ago, have requesedt reports and biopsy results but appears still pending receipt.  He has a history of testosterone deficiency and is on testosterone replacement (androgel 1 pump only) and also zinc supplement. He states that the testosterone helps with his energy, libido, muscle mass. Lab Results  Component Value Date   TESTOSTERONE 473 08/14/2018     Current Medications:  Current Outpatient Medications on File Prior to Visit  Medication Sig  . ANDROGEL PUMP 20.25 MG/ACT (1.62%) GEL APPLY 2 PUMPS TO THE SKIN DAILY (Patient taking differently: APPLY 1 PUMP TO THE SKIN DAILY)  . bisoprolol-hydrochlorothiazide (ZIAC) 5-6.25 MG tablet Take 1 tablet daily for BP  . Cholecalciferol (VITAMIN D3) 5000 units CAPS Take 2 capsules by mouth daily.  . cyclobenzaprine (FLEXERIL) 5 MG tablet Take 1 tablet (5 mg total) by mouth 3 (three) times daily as needed for muscle spasms.  Marland Kitchen dexamethasone (DECADRON) 0.5 MG/5ML solution Take 0.5 mg by mouth 4 (four) times daily as needed. Oral rinse for kanker sores as needed  . lisinopril (PRINIVIL,ZESTRIL) 10 MG tablet Take 1 tablet Daily for BP  . Multiple Vitamins-Minerals (MULTIVITAMIN PO) Take by mouth.  . Omega 3-6-9 Fatty Acids (OMEGA 3-6-9 COMPLEX PO) Take by mouth every evening.   . rosuvastatin (CRESTOR) 20 MG tablet TAKE 1 TABLET BY MOUTH DAILY GENERIC EQUIVALENT FOR CRESTOR  . sertraline (ZOLOFT) 100 MG tablet Take 1 tablet (100 mg total) by mouth daily.  . sildenafil (VIAGRA) 100 MG tablet 1/2-1 pill as needed 1 hour before intercourse, can get with good RX from [employer]  . Zinc 50 MG TABS Take 1 tablet by mouth every evening.   No current facility-administered medications on file prior to visit.      Allergies: No Known  Allergies   Medical History:  Past Medical History:  Diagnosis Date  . DDD (degenerative disc disease), lumbar   . Hyperlipidemia, mixed   . Hypertension   . Hypogonadism male   . Penile wart   . Vitamin D deficiency   . Wears contact lenses    Family history- Reviewed and unchanged Social history- Reviewed and unchanged   Review of Systems:  Review of Systems  Constitutional: Negative for malaise/fatigue and weight loss.  HENT: Negative for hearing loss and tinnitus.   Eyes: Negative for blurred vision and double vision.  Respiratory: Negative for cough, shortness of breath and wheezing.   Cardiovascular: Negative for chest pain, palpitations, orthopnea, claudication and leg swelling.  Gastrointestinal: Negative for abdominal pain, blood in stool, constipation, diarrhea, heartburn, melena, nausea and vomiting.  Genitourinary: Negative.   Musculoskeletal: Negative for joint pain and myalgias.  Skin: Negative for rash.  Neurological: Negative for dizziness, tingling, sensory change, weakness and headaches.  Endo/Heme/Allergies: Negative for polydipsia.  Psychiatric/Behavioral: Negative.   All other systems reviewed and are negative.     Physical Exam: BP 102/68   Pulse 64   Temp 97.7 F (36.5 C)   Wt 163 lb (73.9 kg)   SpO2 98%   BMI 26.31  kg/m  Wt Readings from Last 3 Encounters:  11/26/18 163 lb (73.9 kg)  08/14/18 167 lb 3.2 oz (75.8 kg)  05/22/18 166 lb (75.3 kg)   General Appearance: Well nourished, in no apparent distress. Eyes: PERRLA, EOMs, conjunctiva no swelling or erythema Sinuses: No Frontal/maxillary tenderness ENT/Mouth: Ext aud canals clear, TMs without erythema, bulging. No erythema, swelling, or exudate on post pharynx.  Tonsils not swollen or erythematous. Hearing normal.  Neck: Supple, thyroid normal.  Respiratory: Respiratory effort normal, BS equal bilaterally without rales, rhonchi, wheezing or stridor.  Cardio: RRR with no MRGs. Brisk  peripheral pulses without edema.  Abdomen: Soft, + BS.  Non tender, no guarding, rebound, hernias, masses. Lymphatics: Non tender without lymphadenopathy.  Musculoskeletal: Full ROM, 5/5 strength, Normal gait Skin: Warm, dry without rashes, lesions, ecchymosis. He has cyst to right upper anterior thigh, appros 12 mm area, open head, mildly tender without discharge or erythema. Non-fluctuant.  Neuro: Cranial nerves intact. No cerebellar symptoms.  Psych: Awake and oriented X 3, normal affect, Insight and Judgment appropriate.    Dan Maker, NP 8:59 AM [facility]

## 2018-11-26 ENCOUNTER — Encounter: Payer: Self-pay | Admitting: Adult Health

## 2018-11-26 ENCOUNTER — Other Ambulatory Visit: Payer: Self-pay

## 2018-11-26 ENCOUNTER — Ambulatory Visit (INDEPENDENT_AMBULATORY_CARE_PROVIDER_SITE_OTHER): Payer: BLUE CROSS/BLUE SHIELD | Admitting: Adult Health

## 2018-11-26 VITALS — BP 102/68 | HR 64 | Temp 97.7°F | Wt 163.0 lb

## 2018-11-26 DIAGNOSIS — E349 Endocrine disorder, unspecified: Secondary | ICD-10-CM | POA: Diagnosis not present

## 2018-11-26 DIAGNOSIS — E8881 Metabolic syndrome: Secondary | ICD-10-CM

## 2018-11-26 DIAGNOSIS — Z79899 Other long term (current) drug therapy: Secondary | ICD-10-CM | POA: Diagnosis not present

## 2018-11-26 DIAGNOSIS — E782 Mixed hyperlipidemia: Secondary | ICD-10-CM

## 2018-11-26 DIAGNOSIS — I10 Essential (primary) hypertension: Secondary | ICD-10-CM

## 2018-11-26 DIAGNOSIS — N182 Chronic kidney disease, stage 2 (mild): Secondary | ICD-10-CM | POA: Diagnosis not present

## 2018-11-26 DIAGNOSIS — F419 Anxiety disorder, unspecified: Secondary | ICD-10-CM

## 2018-11-26 DIAGNOSIS — R7989 Other specified abnormal findings of blood chemistry: Secondary | ICD-10-CM

## 2018-11-26 DIAGNOSIS — R7309 Other abnormal glucose: Secondary | ICD-10-CM

## 2018-11-26 DIAGNOSIS — E559 Vitamin D deficiency, unspecified: Secondary | ICD-10-CM | POA: Diagnosis not present

## 2018-11-26 DIAGNOSIS — E663 Overweight: Secondary | ICD-10-CM

## 2018-11-26 DIAGNOSIS — G4700 Insomnia, unspecified: Secondary | ICD-10-CM

## 2018-11-26 NOTE — Patient Instructions (Signed)
Goals    . DIET - INCREASE WATER INTAKE     Aim for 65+ fluid ounces daily         Epidermal Cyst  An epidermal cyst is a sac made of skin tissue. The sac contains a substance called keratin. Keratin is a protein that is normally secreted through the hair follicles. When keratin becomes trapped in the top layer of skin (epidermis), it can form an epidermal cyst. Epidermal cysts can be found anywhere on your body. These cysts are usually harmless (benign), and they may not cause symptoms unless they become infected. What are the causes? This condition may be caused by:  A blocked hair follicle.  A hair that curls and re-enters the skin instead of growing straight out of the skin (ingrown hair).  A blocked pore.  Irritated skin.  An injury to the skin.  Certain conditions that are passed along from parent to child (inherited).  Human papillomavirus (HPV).  Long-term (chronic) sun damage to the skin. What increases the risk? The following factors may make you more likely to develop an epidermal cyst:  Having acne.  Being overweight.  Being 1430-[age] years old. What are the signs or symptoms? The only symptom of this condition may be a small, painless lump underneath the skin. When an epidermal cyst ruptures, it may become infected. Symptoms may include:  Redness.  Inflammation.  Tenderness.  Warmth.  Fever.  Keratin draining from the cyst. Keratin is grayish-white, bad-smelling substance.  Pus draining from the cyst. How is this diagnosed? This condition is diagnosed with a physical exam.  In some cases, you may have a sample of tissue (biopsy) taken from your cyst to be examined under a microscope or tested for bacteria.  You may be referred to a health care provider who specializes in skin care (dermatologist). How is this treated? In many cases, epidermal cysts go away on their own without treatment. If a cyst becomes infected, treatment may include:   Opening and draining the cyst, done by a health care provider. After draining, minor surgery to remove the rest of the cyst may be done.  Antibiotic medicine.  Injections of medicines (steroids) that help to reduce inflammation.  Surgery to remove the cyst. Surgery may be done if the cyst: ? Becomes large. ? Bothers you. ? Has a chance of turning into cancer.  Do not try to open a cyst yourself. Follow these instructions at home:  Take over-the-counter and prescription medicines only as told by your health care provider.  If you were prescribed an antibiotic medicine, take it it as told by your health care provider. Do not stop using the antibiotic even if you start to feel better.  Keep the area around your cyst clean and dry.  Wear loose, dry clothing.  Avoid touching your cyst.  Check your cyst every day for signs of infection. Check for: ? Redness, swelling, or pain. ? Fluid or blood. ? Warmth. ? Pus or a bad smell.  Keep all follow-up visits as told by your health care provider. This is important. How is this prevented?  Wear clean, dry, clothing.  Avoid wearing tight clothing.  Keep your skin clean and dry. Take showers or baths every day. Contact a health care provider if:  Your cyst develops symptoms of infection.  Your condition is not improving or is getting worse.  You develop a cyst that looks different from other cysts you have had.  You have a fever. Get help  right away if:  Redness spreads from the cyst into the surrounding area. Summary  An epidermal cyst is a sac made of skin tissue. These cysts are usually harmless (benign), and they may not cause symptoms unless they become infected.  If a cyst becomes infected, treatment may include surgery to open and drain the cyst, or to remove it. Treatment may also include medicines by mouth or through an injection.  Take over-the-counter and prescription medicines only as told by your health care  provider. If you were prescribed an antibiotic medicine, take it as told by your health care provider. Do not stop using the antibiotic even if you start to feel better.  Contact a health care provider if your condition is not improving or is getting worse.  Keep all follow-up visits as told by your health care provider. This is important. This information is not intended to replace advice given to you by your health care provider. Make sure you discuss any questions you have with your health care provider. Document Released: 06/18/2004 Document Revised: 01/29/2018 Document Reviewed: 01/29/2018 Elsevier Interactive Patient Education  2019 [employer].

## 2018-11-27 ENCOUNTER — Other Ambulatory Visit: Payer: Self-pay | Admitting: Adult Health

## 2018-11-27 LAB — COMPLETE METABOLIC PANEL WITH GFR
AG Ratio: 2.1 (calc) (ref 1.0–2.5)
ALT: 39 U/L (ref 9–46)
AST: 23 U/L (ref 10–35)
Albumin: 4.4 g/dL (ref 3.6–5.1)
Alkaline phosphatase (APISO): 54 U/L (ref 35–144)
BUN: 20 mg/dL (ref 7–25)
CO2: 29 mmol/L (ref 20–32)
Calcium: 10 mg/dL (ref 8.6–10.3)
Chloride: 102 mmol/L (ref 98–110)
Creat: 1.09 mg/dL (ref 0.70–1.25)
GFR, Est African American: 85 mL/min/{1.73_m2} (ref >=60)
GFR, Est Non African American: 73 mL/min/{1.73_m2} (ref >=60)
Globulin: 2.1 g/dL (calc) (ref 1.9–3.7)
Glucose, Bld: 81 mg/dL (ref 65–99)
Potassium: 4.4 mmol/L (ref 3.5–5.3)
Sodium: 138 mmol/L (ref 135–146)
Total Bilirubin: 0.6 mg/dL (ref 0.2–1.2)
Total Protein: 6.5 g/dL (ref 6.1–8.1)

## 2018-11-27 LAB — CBC WITH DIFFERENTIAL/PLATELET
Absolute Monocytes: 986 cells/uL — ABNORMAL HIGH (ref 200–950)
Basophils Absolute: 37 cells/uL (ref 0–200)
Basophils Relative: 0.4 %
Eosinophils Absolute: 214 cells/uL (ref 15–500)
Eosinophils Relative: 2.3 %
HCT: 48.9 % (ref 38.5–50.0)
Hemoglobin: 16.4 g/dL (ref 13.2–17.1)
Lymphs Abs: 2186 cells/uL (ref 850–3900)
MCH: 31.2 pg (ref 27.0–33.0)
MCHC: 33.5 g/dL (ref 32.0–36.0)
MCV: 93.1 fL (ref 80.0–100.0)
MPV: 10.3 fL (ref 7.5–12.5)
Monocytes Relative: 10.6 %
Neutro Abs: 5878 cells/uL (ref 1500–7800)
Neutrophils Relative %: 63.2 %
Platelets: 202 10*3/uL (ref 140–400)
RBC: 5.25 10*6/uL (ref 4.20–5.80)
RDW: 12.3 % (ref 11.0–15.0)
Total Lymphocyte: 23.5 %
WBC: 9.3 10*3/uL (ref 3.8–10.8)

## 2018-11-27 LAB — LIPID PANEL
Cholesterol: 210 mg/dL — ABNORMAL HIGH (ref <200)
HDL: 38 mg/dL — ABNORMAL LOW (ref >=40)
LDL Cholesterol (Calc): 132 mg/dL (calc) — ABNORMAL HIGH
Non-HDL Cholesterol (Calc): 172 mg/dL (calc) — ABNORMAL HIGH (ref <130)
Total CHOL/HDL Ratio: 5.5 (calc) — ABNORMAL HIGH (ref <5.0)
Triglycerides: 259 mg/dL — ABNORMAL HIGH (ref <150)

## 2018-11-27 LAB — MAGNESIUM: Magnesium: 2.1 mg/dL (ref 1.5–2.5)

## 2018-11-27 LAB — TSH: TSH: 0.59 mIU/L (ref 0.40–4.50)

## 2018-11-27 MED ORDER — ROSUVASTATIN CALCIUM 40 MG PO TABS: 40.0000 mg | ORAL_TABLET | Freq: Every day | ORAL | 1 refills | Status: DC

## 2018-12-10 NOTE — Progress Notes (Signed)
  Subjective:    Patient ID: Philip Montgomery, male    DOB: [birth year], 58 y.o.   MRN: [id number]  HPI   Patient is a very nice 58 yo MWM with Hypertension, Hyperlipidemia, Pre-Diabetes and Vitamin D Deficiency.  Patient denies any HA, dizziness, CP.  Patient  presents with a draining cyst of his anterior Right thigh.  Medication Sig  . ANDROGEL PUMP 1.62% GEL APPLY 2 PUMPS TO THE SKIN DAILY (Patient taking differently: APPLY 1 PUMP TO THE SKIN DAILY)  .  (ZIAC) 5-6.25 MG tablet Take 1 tablet daily for BP  . VITAMIN D 5000 units  Take 2 capsules by mouth daily.  . cyclobenzaprine 5 MG tablet Take 1 tablet  3 times daily as needed for muscle spasms.  Marland Kitchen dexamethasone  0.5 MG/5ML soln Oral rinse for kanker sores as needed  . lisinopril10 MG Take 1 tablet Daily for BP  . Multiple Vitamins-Minerals  Take daoily  . Omega 3-6-9 Fatty Acids  Takeevery evening.   . rosuvastatin  40 MG tablet Take 1 tablet (40 mg total) by mouth daily after supper.  . sertraline  100 MG tablet Take 1 tablet (100 mg total) by mouth daily.  . sildenafil  100 MG tablet 1/2-1 pill as needed 1 hour before intercourse, can get with good RX from [employer]  . Zinc 50 MG TABS Take 1 tablet by mouth every evening.   No Known Allergies  Review of Systems   10 point systems review negative except as above.    Objective:   Physical Exam  BP 120/76   Pulse 76   Temp (!) 97.3 F (36.3 C)   Resp 16   Ht 5\' 6"  (1.676 m)   Wt 164 lb 9.6 oz (74.7 kg)   BMI 26.57 kg/m   HEENT - WNL. Neck - supple.  Chest - Clear equal BS. Cor - Nl HS. RRR w/o sig MGR. PP 1(+). No edema. MS- FROM w/o deformities.  Gait Nl. Neuro -  Nl w/o focal abnormalities. Skin - Sl tender 24 mm palpable sub-cutaneous mass of Rt anterior mid thigh.  Procedure  (CPT - 11402)    After informed consent and aseptic prep with alcohol , the area was anesthetized locally in a circumferential fashion with 2 ml of Lidocaine 1%.  Then a 20 mm vertical incision  was made exposing a sebaceous cyst. Then with a #10 scalpel and dissecting scissors the cyst was sharply  freed up and delivered intact & in toto. Then the wound edges were approximated, aligned and everted with #2  Vertical mattress sutures of Nylon 3-0. Then a running lock stitch x #4 with Nylon 3-0 secured the alignment. Wound was cleansed with soap/water and antibiotic ung applied with a 2 " guaze and a 4" x 6" Tegaderm . Patient was instructed in wound care.    Assessment & Plan:   1. Essential hypertension   2. Sebaceous cyst

## 2018-12-11 ENCOUNTER — Encounter: Payer: Self-pay | Admitting: Internal Medicine

## 2018-12-11 ENCOUNTER — Other Ambulatory Visit: Payer: Self-pay

## 2018-12-11 ENCOUNTER — Ambulatory Visit: Payer: BLUE CROSS/BLUE SHIELD | Admitting: Internal Medicine

## 2018-12-11 VITALS — BP 120/76 | HR 76 | Temp 97.3°F | Resp 16 | Ht 66.0 in | Wt 164.6 lb

## 2018-12-11 DIAGNOSIS — L723 Sebaceous cyst: Secondary | ICD-10-CM

## 2018-12-11 DIAGNOSIS — E663 Overweight: Secondary | ICD-10-CM | POA: Diagnosis not present

## 2018-12-11 DIAGNOSIS — I10 Essential (primary) hypertension: Secondary | ICD-10-CM | POA: Diagnosis not present

## 2018-12-19 ENCOUNTER — Encounter: Payer: Self-pay | Admitting: Internal Medicine

## 2018-12-19 ENCOUNTER — Ambulatory Visit: Payer: BLUE CROSS/BLUE SHIELD | Admitting: Internal Medicine

## 2018-12-19 ENCOUNTER — Other Ambulatory Visit: Payer: Self-pay

## 2018-12-19 VITALS — BP 120/80 | HR 78 | Temp 96.8°F | Ht 66.0 in | Wt 166.6 lb

## 2018-12-19 DIAGNOSIS — L723 Sebaceous cyst: Secondary | ICD-10-CM

## 2018-12-19 NOTE — Progress Notes (Signed)
   For suture removal wound of Rt mid anterior thigh s/p excision of large sebaceous cyst  - Wound clean w/o sign of infection. Sutures removed . Note sl separation of end of wound . No Drainage. Steristrips applied & covered again with a 2" x 3" Tegaderm.   - Instructed to maintain the steristrips / Tegaderm  in 5-7 days.

## 2018-12-20 ENCOUNTER — Ambulatory Visit: Payer: BLUE CROSS/BLUE SHIELD | Admitting: Internal Medicine

## 2019-01-17 ENCOUNTER — Other Ambulatory Visit: Payer: Self-pay | Admitting: Adult Health

## 2019-01-17 DIAGNOSIS — I10 Essential (primary) hypertension: Secondary | ICD-10-CM

## 2019-03-03 ENCOUNTER — Other Ambulatory Visit: Payer: Self-pay | Admitting: Internal Medicine

## 2019-03-04 ENCOUNTER — Other Ambulatory Visit: Payer: Self-pay | Admitting: Internal Medicine

## 2019-03-04 DIAGNOSIS — E349 Endocrine disorder, unspecified: Secondary | ICD-10-CM

## 2019-03-04 MED ORDER — TESTOSTERONE 20.25 MG/ACT (1.62%) TD GEL: TRANSDERMAL | 1 refills | Status: DC

## 2019-03-22 ENCOUNTER — Ambulatory Visit: Payer: BLUE CROSS/BLUE SHIELD | Admitting: Internal Medicine

## 2019-05-15 NOTE — Progress Notes (Signed)
Assessment and Plan:  Philip Montgomery was seen today for medication management.  Diagnoses and all orders for this visit:  Anxiety/ Insomnia, unspecified type GAD-7 of 9 today;  Continue sertraline 100 mg, defer increasing dose; after discussion will tray adding low dose trazodone for possible benefit with sleep and with mood. Have reviewed risk of SS with patient, symptoms reviewed and information provided on AVS Initiate 25 mg at night prior to sleep; may increase dose by 25 mg every 3-4 days if needed up to 100 mg nightly PRN sleep; use lowest necessary dose Xanax 0.5 mg 1 tab PRN prior to work physical; cautioned not to drive after taking this medication; he expresses understanding Follow up in 1 month if not improved  -     ALPRAZolam (XANAX) 0.5 MG tablet; Take 1 tab as needed 30 min prior to OV/blood draw. -     traZODone (DESYREL) 50 MG tablet; 1/2-2 tablet for sleep 1 hour prior to bedtime.  Cough Cough- multifactorial-  get on OTC nexium x 2 weeks, try increasing HOB by 2-3 inches by inserting blocks, may need to add H2/refer GI if improved but not fully controlled.  If no improvement get on allergy pill, voice rest, suppress cough with OTC sugar free candy and dylsym med.   Doubt related to BP medication as only having at night  HPI not suggestive if infectious etiology Follow up 1 month if not improved with H2i or PPI   Further disposition pending results of labs. Discussed med's effects and SE's.   Over 15 minutes of exam, counseling, chart review, and critical decision making was performed.   Future Appointments  Date Time Provider Department Center  08/21/2019  3:00 PM [name], PA-C [facility] None    ------------------------------------------------------------------------------------------------------------------   HPI BP 110/78   Pulse 71   Temp 97.7 F (36.5 C)   Ht 5\' 6"  (1.676 m)   Wt 170 lb (77.1 kg)   SpO2 97%   BMI 27.44 kg/m   58 y.o.male presents for  discussion of insomnia and also to request refill of xanax which he has felt beneficial in the past to take prior to work physical due to severe anxiety associated with this.   PDMP reviewed and xanax 0.5 mg three tabs sent in to take 1 tab prior to work physical. Cautioned not to drive after taking this medication and he expresses understanding.   He reports ongoing insomnia; primarily reports difficulty staying asleep and frequent awakening, worse in recent months.He has tried 25-50 mg of benadryl and melatonin which is helpful with onset but not maintaining sleep consistently. He requests refill of which he reports has taken in the past. Discussed we no longer recommend initiating ambien due to concerns with side effects; reviewed alternative options. He also report recent increased anxiety; interested in increasing sertraline from current 100 mg daily dose.   He also reports recently coughing when lying supine x 1 month; denies significant allergy symptoms this year, though does typically have mildly this time of year and has taken claritin in the past but not recently. Denies GERD, hoarseness, reflux/burning. Does endorse mild sense of post nasal drip intermittent.   Past Medical History:  Diagnosis Date  . DDD (degenerative disc disease), lumbar   . Hyperlipidemia, mixed   . Hypertension   . Hypogonadism male   . Penile wart   . Vitamin D deficiency   . Wears contact lenses      No Known Allergies  Current Outpatient  Medications on File Prior to Visit  Medication Sig  . bisoprolol-hydrochlorothiazide (ZIAC) 5-6.25 MG tablet Take 1 tablet Daily for BP  . Cholecalciferol (VITAMIN D3) 5000 units CAPS Take 2 capsules by mouth daily.  . cyclobenzaprine (FLEXERIL) 5 MG tablet Take 1 tablet (5 mg total) by mouth 3 (three) times daily as needed for muscle spasms.  Marland Kitchen dexamethasone (DECADRON) 0.5 MG/5ML solution Take 0.5 mg by mouth 4 (four) times daily as needed. Oral rinse for kanker  sores as needed  . lisinopril (ZESTRIL) 10 MG tablet Take 1 tablet Daily for BP  . Multiple Vitamins-Minerals (MULTIVITAMIN PO) Take by mouth.  . Omega 3-6-9 Fatty Acids (OMEGA 3-6-9 COMPLEX PO) Take by mouth every evening.   . rosuvastatin (CRESTOR) 20 MG tablet Take 1 tablet Daily for Cholesterol  . sertraline (ZOLOFT) 100 MG tablet Take 1 tablet (100 mg total) by mouth daily.  . sildenafil (VIAGRA) 100 MG tablet 1/2-1 pill as needed 1 hour before intercourse, can get with good RX from [employer]  . Testosterone (ANDROGEL PUMP) 20.25 MG/ACT (1.62%) GEL APPLY 2 PUMPS TO THE SKIN DAILY  . Zinc 50 MG TABS Take 1 tablet by mouth every evening.   No current facility-administered medications on file prior to visit.     ROS: all negative except above.   Physical Exam:  BP 110/78   Pulse 71   Temp 97.7 F (36.5 C)   Ht 5\' 6"  (1.676 m)   Wt 170 lb (77.1 kg)   SpO2 97%   BMI 27.44 kg/m   General Appearance: Well nourished, in no apparent distress. Eyes: conjunctiva no swelling or erythema ENT/Mouth: No erythema, swelling, or exudate on post pharynx.  Tonsils not swollen or erythematous. Hearing normal.  Neck: Supple, thyroid normal.  Respiratory: Respiratory effort normal, BS equal bilaterally without rales, rhonchi, wheezing or stridor.  Cardio: RRR with no MRGs. Brisk peripheral pulses without edema.  Abdomen: Soft, + BS.  Non tender, no guarding, rebound, hernias, masses. Lymphatics: Non tender without lymphadenopathy.  Musculoskeletal: Full ROM, 5/5 strength, normal gait.  Skin: Warm, dry without rashes, lesions, ecchymosis.  Neuro: Cranial nerves intact. Normal muscle tone, no cerebellar symptoms. Sensation intact.  Psych: Awake and oriented X 3, normal affect, Insight and Judgment appropriate.     Izora Ribas, NP 5:30 PM [facility]

## 2019-05-16 ENCOUNTER — Encounter: Payer: Self-pay | Admitting: Adult Health

## 2019-05-16 ENCOUNTER — Other Ambulatory Visit: Payer: Self-pay

## 2019-05-16 ENCOUNTER — Ambulatory Visit: Payer: BLUE CROSS/BLUE SHIELD | Admitting: Adult Health

## 2019-05-16 VITALS — BP 110/78 | HR 71 | Temp 97.7°F | Ht 66.0 in | Wt 170.0 lb

## 2019-05-16 DIAGNOSIS — R05 Cough: Secondary | ICD-10-CM | POA: Diagnosis not present

## 2019-05-16 DIAGNOSIS — G4700 Insomnia, unspecified: Secondary | ICD-10-CM

## 2019-05-16 DIAGNOSIS — F419 Anxiety disorder, unspecified: Secondary | ICD-10-CM

## 2019-05-16 DIAGNOSIS — R059 Cough, unspecified: Secondary | ICD-10-CM

## 2019-05-16 MED ORDER — ALPRAZOLAM 0.5 MG PO TABS: ORAL_TABLET | ORAL | 0 refills | Status: DC

## 2019-05-16 MED ORDER — TRAZODONE HCL 50 MG PO TABS: ORAL_TABLET | ORAL | 2 refills | Status: DC

## 2019-05-16 NOTE — Patient Instructions (Addendum)
Try OTC nexium 1 tab 1 hour prior to bedtime x 2 weeks to see if this improvs your cough   If not helpful, try 2 weeks of daily claritin/zyrtec etc allergy pill      Please be aware that some of the medications that you are on can sometimes cause a rare and potentially dangerous adverse reaction, called SEROTONIN SYNDROME: Symptoms of this condition include (but are not limited to):  Agitation or restlessness, confusion, rapid heart rate and high blood pressure, dilated pupils, loss of muscle coordination or twitching muscles, muscle rigidity/stiffness, sweating and/or flushing, diarrhea, headache, shivering, goose bumps. If you have any of these symptoms you may have to stop the medication. Call your health care provider immediately.  Severe serotonin syndrome can be life-threatening emergency. Signs and symptoms of a severe reaction may include: high fever, seizures, irregular heartbeat, unconsciousness or altered level of awareness or personality changes.  If you have any of these new symptoms, call 911 or have someone take you to the emergency room.       Insomnia Insomnia is frequent trouble falling and/or staying asleep. Insomnia can be a long term problem or a short term problem. Both are common. Insomnia can be a short term problem when the wakefulness is related to a certain stress or worry. Long term insomnia is often related to ongoing stress during waking hours and/or poor sleeping habits. Overtime, sleep deprivation itself can make the problem worse. Every little thing feels more severe because you are overtired and your ability to cope is decreased. CAUSES   Stress, anxiety, and depression.  Poor sleeping habits.  Distractions such as TV in the bedroom.  Naps close to bedtime.  Engaging in emotionally charged conversations before bed.  Technical reading before sleep.  Alcohol and other sedatives. They may make the problem worse. They can hurt normal sleep patterns and  normal dream activity.  Stimulants such as caffeine for several hours prior to bedtime.  Pain syndromes and shortness of breath can cause insomnia.  Exercise late at night.  Changing time zones may cause sleeping problems (jet lag). It is sometimes helpful to have someone observe your sleeping patterns. They should look for periods of not breathing during the night (sleep apnea). They should also look to see how long those periods last. If you live alone or observers are uncertain, you can also be observed at a sleep clinic where your sleep patterns will be professionally monitored. Sleep apnea requires a checkup and treatment. Give your caregivers your medical history. Give your caregivers observations your family has made about your sleep.  SYMPTOMS   Not feeling rested in the morning.  Anxiety and restlessness at bedtime.  Difficulty falling and staying asleep. TREATMENT   Your caregiver may prescribe treatment for an underlying medical disorders. Your caregiver can give advice or help if you are using alcohol or other drugs for self-medication. Treatment of underlying problems will usually eliminate insomnia problems.  Medications can be prescribed for short time use. They are generally not recommended for lengthy use.  Over-the-counter sleep medicines are not recommended for lengthy use. They can be habit forming.  You can promote easier sleeping by making lifestyle changes such as:  Using relaxation techniques that help with breathing and reduce muscle tension.  Exercising earlier in the day.  Changing your diet and the time of your last meal. No night time snacks.  Establish a regular time to go to bed.  Counseling can help with stressful problems and  worry.  Soothing music and white noise may be helpful if there are background noises you cannot remove.  Stop tedious detailed work at least one hour before bedtime. HOME CARE INSTRUCTIONS   Keep a diary. Inform your  caregiver about your progress. This includes any medication side effects. See your caregiver regularly. Take note of:  Times when you are asleep.  Times when you are awake during the night.  The quality of your sleep.  How you feel the next day. This information will help your caregiver care for you.  Get out of bed if you are still awake after 15 minutes. Read or do some quiet activity. Keep the lights down. Wait until you feel sleepy and go back to bed.  Keep regular sleeping and waking hours. Avoid naps.  Exercise regularly.  Avoid distractions at bedtime. Distractions include watching television or engaging in any intense or detailed activity like attempting to balance the household checkbook.  Develop a bedtime ritual. Keep a familiar routine of bathing, brushing your teeth, climbing into bed at the same time each night, listening to soothing music. Routines increase the success of falling to sleep faster.  Use relaxation techniques. This can be using breathing and muscle tension release routines. It can also include visualizing peaceful scenes. You can also help control troubling or intruding thoughts by keeping your mind occupied with boring or repetitive thoughts like the old concept of counting sheep. You can make it more creative like imagining planting one beautiful flower after another in your backyard garden.  During your day, work to eliminate stress. When this is not possible use some of the previous suggestions to help reduce the anxiety that accompanies stressful situations. MAKE SURE YOU:   Understand these instructions.  Will watch your condition.  Will get help right away if you are not doing well or get worse. Document Released: 07/15/2000 Document Revised: 10/10/2011 Document Reviewed: 08/15/2007 [facility] Patient Information 2015 [place], [place]. This information is not intended to replace advice given to you by your health care provider. Make sure you discuss  any questions you have with your health care provider.     Trazodone tablets What is this medicine? TRAZODONE (TRAZ oh done) is used to treat depression. This medicine may be used for other purposes; ask your health care provider or pharmacist if you have questions. COMMON BRAND NAME(S): Desyrel What should I tell my health care provider before I take this medicine? They need to know if you have any of these conditions:  attempted suicide or thinking about it  bipolar disorder  bleeding problems  glaucoma  heart disease, or previous heart attack  irregular heart beat  kidney or liver disease  low levels of sodium in the blood  an unusual or allergic reaction to trazodone, other medicines, foods, dyes or preservatives  pregnant or trying to get pregnant  breast-feeding How should I use this medicine? Take this medicine by mouth with a glass of water. Follow the directions on the prescription label. Take this medicine shortly after a meal or a light snack. Take your medicine at regular intervals. Do not take your medicine more often than directed. Do not stop taking this medicine suddenly except upon the advice of your doctor. Stopping this medicine too quickly may cause serious side effects or your condition may worsen. A special MedGuide will be given to you by the pharmacist with each prescription and refill. Be sure to read this information carefully each time. Talk to your pediatrician  regarding the use of this medicine in children. Special care may be needed. Overdosage: If you think you have taken too much of this medicine contact a poison control center or emergency room at once. NOTE: This medicine is only for you. Do not share this medicine with others. What if I miss a dose? If you miss a dose, take it as soon as you can. If it is almost time for your next dose, take only that dose. Do not take double or extra doses. What may interact with this medicine? Do not  take this medicine with any of the following medications:  certain medicines for fungal infections like fluconazole, itraconazole, ketoconazole, posaconazole, voriconazole  cisapride  dronedarone  linezolid  MAOIs like Carbex, Eldepryl, Marplan, Nardil, and Parnate  mesoridazine  methylene blue (injected into a vein)  pimozide  saquinavir  thioridazine This medicine may also interact with the following medications:  alcohol  antiviral medicines for HIV or AIDS  aspirin and aspirin-like medicines  barbiturates like phenobarbital  certain medicines for blood pressure, heart disease, irregular heart beat  certain medicines for depression, anxiety, or psychotic disturbances  certain medicines for migraine headache like almotriptan, eletriptan, frovatriptan, naratriptan, rizatriptan, sumatriptan, zolmitriptan  certain medicines for seizures like carbamazepine and phenytoin  certain medicines for sleep  certain medicines that treat or prevent blood clots like dalteparin, enoxaparin, warfarin  digoxin  fentanyl  lithium  NSAIDS, medicines for pain and inflammation, like ibuprofen or naproxen  other medicines that prolong the QT interval (cause an abnormal heart rhythm) like dofetilide  rasagiline  supplements like St. John's wort, kava kava, valerian  tramadol  tryptophan This list may not describe all possible interactions. Give your health care provider a list of all the medicines, herbs, non-prescription drugs, or dietary supplements you use. Also tell them if you smoke, drink alcohol, or use illegal drugs. Some items may interact with your medicine. What should I watch for while using this medicine? Tell your doctor if your symptoms do not get better or if they get worse. Visit your doctor or health care professional for regular checks on your progress. Because it may take several weeks to see the full effects of this medicine, it is important to continue  your treatment as prescribed by your doctor. Patients and their families should watch out for new or worsening thoughts of suicide or depression. Also watch out for sudden changes in feelings such as feeling anxious, agitated, panicky, irritable, hostile, aggressive, impulsive, severely restless, overly excited and hyperactive, or not being able to sleep. If this happens, especially at the beginning of treatment or after a change in dose, call your health care professional. Dennis Bast may get drowsy or dizzy. Do not drive, use machinery, or do anything that needs mental alertness until you know how this medicine affects you. Do not stand or sit up quickly, especially if you are an older patient. This reduces the risk of dizzy or fainting spells. Alcohol may interfere with the effect of this medicine. Avoid alcoholic drinks. This medicine may cause dry eyes and blurred vision. If you wear contact lenses you may feel some discomfort. Lubricating drops may help. See your eye doctor if the problem does not go away or is severe. Your mouth may get dry. Chewing sugarless gum, sucking hard candy and drinking plenty of water may help. Contact your doctor if the problem does not go away or is severe. What side effects may I notice from receiving this medicine? Side effects that  you should report to your doctor or health care professional as soon as possible:  allergic reactions like skin rash, itching or hives, swelling of the face, lips, or tongue  elevated mood, decreased need for sleep, racing thoughts, impulsive behavior  confusion  fast, irregular heartbeat  feeling faint or lightheaded, falls  feeling agitated, angry, or irritable  loss of balance or coordination  painful or prolonged erections  restlessness, pacing, inability to keep still  suicidal thoughts or other mood changes  tremors  trouble sleeping  seizures  unusual bleeding or bruising Side effects that usually do not require  medical attention (report to your doctor or health care professional if they continue or are bothersome):  change in sex drive or performance  change in appetite or weight  constipation  headache  muscle aches or pains  nausea This list may not describe all possible side effects. Call your doctor for medical advice about side effects. You may report side effects to FDA at 1-800-FDA-1088. Where should I keep my medicine? Keep out of the reach of children. Store at room temperature between 15 and 30 degrees C (59 to 86 degrees F). Protect from light. Keep container tightly closed. Throw away any unused medicine after the expiration date. NOTE: This sheet is a summary. It may not cover all possible information. If you have questions about this medicine, talk to your doctor, pharmacist, or health care provider.  2020 Elsevier/Gold Standard (2018-07-10 11:46:46)

## 2019-05-23 DIAGNOSIS — M5126 Other intervertebral disc displacement, lumbar region: Secondary | ICD-10-CM | POA: Diagnosis not present

## 2019-05-23 DIAGNOSIS — M9903 Segmental and somatic dysfunction of lumbar region: Secondary | ICD-10-CM | POA: Diagnosis not present

## 2019-05-23 DIAGNOSIS — M9904 Segmental and somatic dysfunction of sacral region: Secondary | ICD-10-CM | POA: Diagnosis not present

## 2019-05-23 DIAGNOSIS — M9905 Segmental and somatic dysfunction of pelvic region: Secondary | ICD-10-CM | POA: Diagnosis not present

## 2019-05-27 DIAGNOSIS — M9903 Segmental and somatic dysfunction of lumbar region: Secondary | ICD-10-CM | POA: Diagnosis not present

## 2019-05-27 DIAGNOSIS — M5126 Other intervertebral disc displacement, lumbar region: Secondary | ICD-10-CM | POA: Diagnosis not present

## 2019-05-27 DIAGNOSIS — M9905 Segmental and somatic dysfunction of pelvic region: Secondary | ICD-10-CM | POA: Diagnosis not present

## 2019-05-27 DIAGNOSIS — M9904 Segmental and somatic dysfunction of sacral region: Secondary | ICD-10-CM | POA: Diagnosis not present

## 2019-05-30 ENCOUNTER — Other Ambulatory Visit: Payer: Self-pay

## 2019-05-30 ENCOUNTER — Encounter: Payer: Self-pay | Admitting: Adult Health

## 2019-05-30 ENCOUNTER — Ambulatory Visit: Payer: BC Managed Care – PPO | Admitting: Adult Health

## 2019-05-30 VITALS — BP 108/72 | HR 71 | Temp 97.7°F | Ht 66.0 in | Wt 167.0 lb

## 2019-05-30 DIAGNOSIS — Z23 Encounter for immunization: Secondary | ICD-10-CM

## 2019-05-30 DIAGNOSIS — H2513 Age-related nuclear cataract, bilateral: Secondary | ICD-10-CM | POA: Diagnosis not present

## 2019-05-30 DIAGNOSIS — M533 Sacrococcygeal disorders, not elsewhere classified: Secondary | ICD-10-CM

## 2019-05-30 DIAGNOSIS — H353131 Nonexudative age-related macular degeneration, bilateral, early dry stage: Secondary | ICD-10-CM | POA: Diagnosis not present

## 2019-05-30 MED ORDER — PREDNISONE 20 MG PO TABS: ORAL_TABLET | ORAL | 0 refills | Status: AC

## 2019-05-30 NOTE — Progress Notes (Signed)
Assessment and Plan:  Little was seen today for back pain.  Diagnoses and all orders for this visit:  Sacral back pain, Right - negative straight leg, neuro exam normal today  Prednisone was prescribed,NSAIDs, RICE, and exercise given If not better follow up in office or will obtain xray or refer to PT/orthopedics. Natural history and expected course discussed. Questions answered. Agricultural engineer distributed. Proper lifting, bending technique discussed. Ice to affected area as needed for local pain relief.  Go to the ER if you have any fever/chills, new weakness in your legs, have trouble controlling your urine or bowels, or have worsening pain.  -     predniSONE (DELTASONE) 20 MG tablet; 3 tablets daily with food for 3 days, 2 tabs daily for 3 days, 1 tab a day for 5 days.  Need for influenza vaccine  - Quad valent administered without complication   Further disposition pending results of labs. Discussed med's effects and SE's.   Over 15 minutes of exam, counseling, chart review, and critical decision making was performed.   Future Appointments  Date Time Provider Department Center  08/21/2019  3:00 PM [name], PA-C [id number] None    ------------------------------------------------------------------------------------------------------------------   HPI BP 108/72   Pulse 71   Temp 97.7 F (36.5 C)   Ht 5\' 6"  (1.676 m)   Wt 167 lb (75.8 kg)   SpO2 96%   BMI 26.95 kg/m   58 y.o.male with hx of degenerative disc disease presents for evaluation of lumbar pain; he reports 10-12 days of lower back pain, mainly on R, below waist, deep throbbing pain (4-5/10), non-radiating, but also has sensation of tingling/numbness through R hip. Denies weakness, loss of bladder/bowel control, fever/chills. He reports 4-5 days prior to onset of pain was wrestling with his son and wonders if this may have contributed.   He has been taking aleve - 400 mg BID without significant  benefit; has flexeril 5 mg but hasn't taken due to feeling sedated for 36 hours after taking. Has tried icing, chiropractor (not improvement, was initially worse after this), heat seemed to make pain worse. Pain is worst in the AM opon awakening; otherwise no notable aggravating/alleviating factors.   He has hx of L4-5 ruptured disc and underwent microdiscectomy in 2006 by Dr. 2007 and has done well since other than self limited acute lower back pain episodes. No recent imaging available for review.   Denies fatigue, unintentional weight loss.  Wt Readings from Last 3 Encounters:  05/30/19 167 lb (75.8 kg)  05/16/19 170 lb (77.1 kg)  12/19/18 166 lb 9.6 oz (75.6 kg)   Lab Results  Component Value Date   PSA 1.9 02/05/2018     Past Medical History:  Diagnosis Date  . DDD (degenerative disc disease), lumbar   . Hyperlipidemia, mixed   . Hypertension   . Hypogonadism male   . Penile wart   . Vitamin D deficiency   . Wears contact lenses      No Known Allergies  Current Outpatient Medications on File Prior to Visit  Medication Sig  . ALPRAZolam (XANAX) 0.5 MG tablet Take 1 tab as needed 30 min prior to OV/blood draw.  . bisoprolol-hydrochlorothiazide (ZIAC) 5-6.25 MG tablet Take 1 tablet Daily for BP  . Cholecalciferol (VITAMIN D3) 5000 units CAPS Take 2 capsules by mouth daily.  . cyclobenzaprine (FLEXERIL) 5 MG tablet Take 1 tablet (5 mg total) by mouth 3 (three) times daily as needed for muscle spasms.  08-04-2001 dexamethasone (DECADRON)  0.5 MG/5ML solution Take 0.5 mg by mouth 4 (four) times daily as needed. Oral rinse for kanker sores as needed  . lisinopril (ZESTRIL) 10 MG tablet Take 1 tablet Daily for BP  . Multiple Vitamins-Minerals (MULTIVITAMIN PO) Take by mouth.  . Omega 3-6-9 Fatty Acids (OMEGA 3-6-9 COMPLEX PO) Take by mouth every evening.   . rosuvastatin (CRESTOR) 20 MG tablet Take 1 tablet Daily for Cholesterol  . sertraline (ZOLOFT) 100 MG tablet Take 1 tablet (100 mg  total) by mouth daily.  . sildenafil (VIAGRA) 100 MG tablet 1/2-1 pill as needed 1 hour before intercourse, can get with good RX from [employer]  . Testosterone (ANDROGEL PUMP) 20.25 MG/ACT (1.62%) GEL APPLY 2 PUMPS TO THE SKIN DAILY  . Zinc 50 MG TABS Take 1 tablet by mouth every evening.  . traZODone (DESYREL) 50 MG tablet 1/2-2 tablet for sleep 1 hour prior to bedtime. (Patient not taking: Reported on 05/30/2019)   No current facility-administered medications on file prior to visit.     ROS: all negative except above.   Physical Exam:  BP 108/72   Pulse 71   Temp 97.7 F (36.5 C)   Ht 5\' 6"  (1.676 m)   Wt 167 lb (75.8 kg)   SpO2 96%   BMI 26.95 kg/m   General Appearance: Well nourished, in no apparent distress. Eyes: conjunctiva no swelling or erythema ENT/Mouth: Hearing normal.  Neck: Supple Respiratory: Respiratory effort normal, BS equal bilaterally without rales, rhonchi, wheezing or stridor.  Cardio: RRR with no MRGs. Brisk peripheral pulses without edema.  Abdomen: Soft, + BS.  Non tender, no guarding, rebound, hernias, masses. Lymphatics: Non tender without lymphadenopathy.  Musculoskeletal: Full ROM lumbar and bilateral hips, 5/5 strength throughout lower extremities, negative straight leg raise, normal gait. He does have some tenderness at R SI joint. No palpable muscle spasm.  Skin: Warm, dry without rashes, lesions, ecchymosis.  Neuro: Normal muscle tone, relfexes symmetrical patellar, achilles. Sensation intact.  Psych: Awake and oriented X 3, normal affect, Insight and Judgment appropriate.     Izora Ribas, NP 4:25 PM [facility]

## 2019-05-30 NOTE — Patient Instructions (Signed)
If not improving in another 2 weeks let me know - will get xrays at that point, can consider PT if needed     Sacroiliac Joint Dysfunction  Sacroiliac joint dysfunction is a condition that causes inflammation on one or both sides of the sacroiliac (SI) joint. The SI joint connects the lower part of the spine (sacrum) with the two upper portions of the pelvis (ilium). This condition causes deep aching or burning pain in the low back. In some cases, the pain may also spread into one or both buttocks, hips, or thighs. What are the causes? This condition may be caused by:  Pregnancy. During pregnancy, extra stress is put on the SI joints because the pelvis widens.  Injury, such as: ? Injuries from car accidents. ? Sports-related injuries. ? Work-related injuries.  Having one leg that is shorter than the other.  Conditions that affect the joints, such as: ? Rheumatoid arthritis. ? Gout. ? Psoriatic arthritis. ? Joint infection (septic arthritis). Sometimes, the cause of SI joint dysfunction is not known. What are the signs or symptoms? Symptoms of this condition include:  Aching or burning pain in the lower back. The pain may also spread to other areas, such as: ? Buttocks. ? Groin. ? Thighs.  Muscle spasms in or around the painful areas.  Increased pain when standing, walking, running, stair climbing, bending, or lifting. How is this diagnosed? This condition is diagnosed with a physical exam and medical history. During the exam, the health care provider may move one or both of your legs to different positions to check for pain. Various tests may be done to confirm the diagnosis, including:  Imaging tests to look for other causes of pain. These may include: ? MRI. ? CT scan. ? Bone scan.  Diagnostic injection. A numbing medicine is injected into the SI joint using a needle. If your pain is temporarily improved or stopped after the injection, this can indicate that SI joint  dysfunction is the problem. How is this treated? Treatment depends on the cause and severity of your condition. Treatment options may include:  Ice or heat applied to the lower back area after an injury. This may help reduce pain and muscle spasms.  Medicines to relieve pain or inflammation or to relax the muscles.  Wearing a back brace (sacroiliac brace) to help support the joint while your back is healing.  Physical therapy to increase muscle strength around the joint and flexibility at the joint. This may also involve learning proper body positions and ways of moving to relieve stress on the joint.  Direct manipulation of the SI joint.  Injections of steroid medicine into the joint to reduce pain and swelling.  Radiofrequency ablation to burn away nerves that are carrying pain messages from the joint.  Use of a device that provides electrical stimulation to help reduce pain at the joint.  Surgery to put in screws and plates that limit or prevent joint motion. This is rare. Follow these instructions at home: Medicines  Take over-the-counter and prescription medicines only as told by your health care provider.  Do not drive or use heavy machinery while taking prescription pain medicine.  If you are taking prescription pain medicine, take actions to prevent or treat constipation. Your health care provider may recommend that you: ? Drink enough fluid to keep your urine pale yellow. ? Eat foods that are high in fiber, such as fresh fruits and vegetables, whole grains, and beans. ? Limit foods that are high  in fat and processed sugars, such as fried or sweet foods. ? Take an over-the-counter or prescription medicine for constipation. If you have a brace:  Wear the brace as told by your health care provider. Remove it only as told by your health care provider.  Keep the brace clean.  If the brace is not waterproof: ? Do not let it get wet. ? Cover it with a watertight covering  when you take a bath or a shower. Managing pain, stiffness, and swelling      Icing can help with pain and swelling. Heat may help with muscle tension or spasms. Ask your health care provider if you should use ice or heat.  If directed, put ice on the affected area: ? If you have a removable brace, remove it as told by your health care provider. ? Put ice in a plastic bag. ? Place a towel between your skin and the bag. ? Leave the ice on for 20 minutes, 2-3 times a day.  If directed, apply heat to the affected area. Use the heat source that your health care provider recommends, such as a moist heat pack or a heating pad. ? Place a towel between your skin and the heat source. ? Leave the heat on for 20-30 minutes. ? Remove the heat if your skin turns bright red. This is especially important if you are unable to feel pain, heat, or cold. You may have a greater risk of getting burned. General instructions  Rest as needed. Ask your health care provider what activities are safe for you.  Return to your normal activities as told by your health care provider.  Exercise as directed by your health care provider or physical therapist.  Do not use any products that contain nicotine or tobacco, such as cigarettes and e-cigarettes. These can delay bone healing. If you need help quitting, ask your health care provider.  Keep all follow-up visits as told by your health care provider. This is important. Contact a health care provider if:  Your pain is not controlled with medicine.  You have a fever.  Your pain is getting worse. Get help right away if:  You have weakness, numbness, or tingling in your legs or feet.  You lose control of your bladder or bowel. Summary  Sacroiliac joint dysfunction is a condition that causes inflammation on one or both sides of the sacroiliac (SI) joint.  This condition causes deep aching or burning pain in the low back. In some cases, the pain may also  spread into one or both buttocks, hips, or thighs.  Treatment depends on the cause and severity of your condition. It may include medicines to reduce pain and swelling or to relax muscles. This information is not intended to replace advice given to you by your health care provider. Make sure you discuss any questions you have with your health care provider. Document Released: 10/14/2008 Document Revised: 03/14/2018 Document Reviewed: 08/28/2017 Elsevier Patient Education  2020 [employer].

## 2019-06-12 ENCOUNTER — Other Ambulatory Visit: Payer: Self-pay | Admitting: Physician Assistant

## 2019-06-13 ENCOUNTER — Other Ambulatory Visit: Payer: Self-pay | Admitting: Physician Assistant

## 2019-07-12 ENCOUNTER — Ambulatory Visit: Payer: BC Managed Care – PPO | Admitting: Adult Health

## 2019-07-12 ENCOUNTER — Encounter: Payer: Self-pay | Admitting: Adult Health

## 2019-07-12 ENCOUNTER — Other Ambulatory Visit: Payer: Self-pay

## 2019-07-12 VITALS — BP 98/64 | HR 68 | Temp 97.4°F | Resp 16 | Wt 163.4 lb

## 2019-07-12 DIAGNOSIS — M7711 Lateral epicondylitis, right elbow: Secondary | ICD-10-CM

## 2019-07-12 MED ORDER — ROSUVASTATIN CALCIUM 40 MG PO TABS: ORAL_TABLET | ORAL | 1 refills | Status: AC

## 2019-07-12 MED ORDER — DEXAMETHASONE SODIUM PHOSPHATE 10 MG/ML IJ SOLN
10.0000 mg | Freq: Once | INTRAMUSCULAR | Status: AC
  Administered 2019-07-12: 10 mg

## 2019-07-12 NOTE — Progress Notes (Signed)
Assessment and Plan:  Cristian was seen today for elbow pain.  Diagnoses and all orders for this visit:  Right lateral epicondylitis Injection- area cleaned with alcohol, Dexamethasone 10mg  and 1 CC lidocaine injected into lateral epicondyle, tolerated well though without immediate relief.  Continue NSAIDs, RICE, and exercise given, can get brace, avoid offending activities Follow up in 2 weeks if no improvement  Other orders -     rosuvastatin (CRESTOR) 40 MG tablet; Take 1 tablet Daily for Cholesterol  Further disposition pending results of labs. Discussed med's effects and SE's.   Over 15 minutes of exam, counseling, chart review, and critical decision making was performed.   Future Appointments  Date Time Provider [place]  08/21/2019  3:00 PM Vicie Mutters, PA-C [id number] None    ------------------------------------------------------------------------------------------------------------------   HPI BP 98/64   Pulse 68   Temp (!) 97.4 F (36.3 C)   Resp 16   Wt 163 lb 6.4 oz (74.1 kg)   BMI 26.37 kg/m   58 y.o.male, R handed, works in [employer] and safety [employer] use,  presents for evaluation of 2 weeks of R lateral elbow pain not improving with aleve BID. He reports prior to onset he was moving some soft soil in his garden with a shovel. Otherwise no fall or other notable event. He reports pain with gripping or flexion has sharp lateral elbow pain 6-7/10, non-radiating.    Past Medical History:  Diagnosis Date  . DDD (degenerative disc disease), lumbar   . Hyperlipidemia, mixed   . Hypertension   . Hypogonadism male   . Penile wart   . Vitamin D deficiency   . Wears contact lenses      No Known Allergies  Current Outpatient Medications on File Prior to Visit  Medication Sig  . ALPRAZolam (XANAX) 0.5 MG tablet Take 1 tab as needed 30 min prior to OV/blood draw.  . bisoprolol-hydrochlorothiazide (ZIAC) 5-6.25 MG tablet Take 1 tablet Daily for BP  .  Cholecalciferol (VITAMIN D3) 5000 units CAPS Take 2 capsules by mouth daily.  Marland Kitchen dexamethasone (DECADRON) 0.5 MG/5ML solution Take 0.5 mg by mouth 4 (four) times daily as needed. Oral rinse for kanker sores as needed  . lisinopril (ZESTRIL) 10 MG tablet Take 1 tablet Daily for BP  . Multiple Vitamins-Minerals (MULTIVITAMIN PO) Take by mouth.  . Omega 3-6-9 Fatty Acids (OMEGA 3-6-9 COMPLEX PO) Take by mouth every evening.   . sertraline (ZOLOFT) 100 MG tablet Take 1 tablet Daily for Mood  . sildenafil (VIAGRA) 100 MG tablet 1/2-1 pill as needed 1 hour before intercourse, can get with good RX from [employer]  . Testosterone (ANDROGEL PUMP) 20.25 MG/ACT (1.62%) GEL APPLY 2 PUMPS TO THE SKIN DAILY  . traZODone (DESYREL) 50 MG tablet 1/2-2 tablet for sleep 1 hour prior to bedtime.  . Zinc 50 MG TABS Take 1 tablet by mouth every evening.   No current facility-administered medications on file prior to visit.    ROS: all negative except above.   Physical Exam:  BP 98/64   Pulse 68   Temp (!) 97.4 F (36.3 C)   Resp 16   Wt 163 lb 6.4 oz (74.1 kg)   BMI 26.37 kg/m   General Appearance: Well nourished, in no apparent distress. Eyes: conjunctiva no swelling or erythema ENT/Mouth: Hearing normal.  Neck: Supple Respiratory: Respiratory effort normal Cardio: Brisk peripheral pulses without edema.  Musculoskeletal: No obvious deformity, normal gait. Right shoulder and right wrist full ROM and nontender.right elbow  without erythema, warmth. without minor swelling, with tenderness at lateral epicondyle with decreased grip.Pain with supination against resistance. Good distal pulses, cap refill, and sensation intact.  ROM Intact bilaterally in upper extremities.  Skin: Warm, dry without rashes, lesions, ecchymosis.  Neuro: Normal muscle tone, Sensation intact.  Psych: Awake and oriented X 3, normal affect, Insight and Judgment appropriate.     Dan Maker, NP 10:43 AM [facility]

## 2019-07-12 NOTE — Patient Instructions (Signed)
Please pick up a tennis elbow band to wear daily for a few months & as needed for any future flares     Tennis Elbow  Tennis elbow (lateral epicondylitis) is inflammation of tendons in your outer forearm, near your elbow. Tendons are tissues that connect muscle to bone. When you have tennis elbow, inflammation affects the tendons that you use to bend your wrist and move your hand up. Inflammation occurs in the lower part of the upper arm bone (humerus), where the tendons connect to the bone (lateral epicondyle). Tennis elbow often affects people who play tennis, but anyone may get the condition from repeatedly extending the wrist or turning the forearm. What are the causes? This condition is usually caused by repeatedly extending the wrist, turning the forearm, and using the hands. It can result from sports or work that requires repetitive forearm movements. In some cases, it may be caused by a sudden injury. What increases the risk? You are more likely to develop tennis elbow if you play tennis or another racket sport. You also have a higher risk if you frequently use your hands for work. Besides people who play tennis, others at greater risk include:  Musicians.  Carpenters, painters, and plumbers.  Cooks.  Cashiers.  People who work in [employer].  Holiday representativeConstruction workers.  Butchers.  People who use computers. What are the signs or symptoms? Symptoms of this condition include:  Pain and tenderness in the forearm and the outer part of the elbow. Pain may be felt only when using the arm, or it may be there all the time.  A burning feeling that starts in the elbow and spreads down the forearm.  A weak grip in the hand. How is this diagnosed? This condition may be diagnosed based on:  Your symptoms and medical history.  A physical exam.  X-rays.  MRI. How is this treated? Resting and icing your arm is often the first treatment. Your health care provider may also  recommend:  Medicines to reduce pain and inflammation. These may be in the form of a pill, topical gels, or shots of a steroid medicine (cortisone).  An elbow strap to reduce stress on the area.  Physical therapy. This may include massage or exercises.  An elbow brace to restrict the movements that cause symptoms. If these treatments do not help relieve your symptoms, your health care provider may recommend surgery to remove damaged muscle and reattach healthy muscle to bone. Follow these instructions at home: Activity  Rest your elbow and wrist and avoid activities that cause symptoms, as told by your health care provider.  Do physical therapy exercises as instructed.  If you lift an object, lift it with your palm facing up. This reduces stress on your elbow. Lifestyle  If your tennis elbow is caused by sports, check your equipment and make sure that: ? You are using it correctly. ? It is the best fit for you.  If your tennis elbow is caused by work or computer use, take frequent breaks to stretch your arm. Talk with your manager about ways to manage your condition at work. If you have a brace:  Wear the brace or strap as told by your health care provider. Remove it only as told by your health care provider.  Loosen the brace if your fingers tingle, become numb, or turn cold and blue.  Keep the brace clean.  If the brace is not waterproof, ask if you may remove it for bathing. If you  must keep the brace on while bathing: ? Do not let it get wet. ? Cover it with a watertight covering when you take a bath or a shower. General instructions   If directed, put ice on the painful area: ? Put ice in a plastic bag. ? Place a towel between your skin and the bag. ? Leave the ice on for 20 minutes, 2-3 times a day.  Take over-the-counter and prescription medicines only as told by your health care provider.  Keep all follow-up visits as told by your health care provider. This is  important. Contact a health care provider if:  You have pain that gets worse or does not get better with treatment.  You have numbness or weakness in your forearm, hand, or fingers. Summary  Tennis elbow (lateral epicondylitis) is inflammation of tendons in your outer forearm, near your elbow.  Common symptoms include pain and tenderness in your forearm and the outer part of your elbow.  This condition is usually caused by repeatedly extending your wrist, turning your forearm, and using your hands.  The first treatment is often resting and icing your arm to relieve symptoms. Further treatment may include taking medicine, getting physical therapy, wearing a brace or strap, or having surgery. This information is not intended to replace advice given to you by your health care provider. Make sure you discuss any questions you have with your health care provider. Document Released: 07/18/2005 Document Revised: 04/13/2018 Document Reviewed: 05/02/2017 Elsevier Patient Education  2020 [place] Ask your health care provider which exercises are safe for you. Do exercises exactly as told by your health care provider and adjust them as directed. It is normal to feel mild stretching, pulling, tightness, or discomfort as you do these exercises. Stop right away if you feel sudden pain or your pain gets worse. Do not begin these exercises until told by your health care provider. Stretching and range-of-motion exercises These exercises warm up your muscles and joints and improve the movement and flexibility of your elbow. These exercises also help to relieve pain, numbness, and tingling. Wrist flexion, assisted  1. Straighten your left / right elbow in front of you with your palm facing down toward the floor. ? If told by your health care provider, bend your left / right elbow to a 90-degree angle (right angle) at your side. 2. With your other hand, gently push over the  back of your left / right hand so your fingers point toward the floor (flexion). Stop when you feel a gentle stretch on the back of your forearm. 3. Hold this position for __________ seconds. Repeat __________ times. Complete this exercise __________ times a day. Wrist extension, assisted  1. Straighten your left / right elbow in front of you with your palm facing up toward the ceiling. ? If told by your health care provider, bend your left / right elbow to a 90-degree angle (right angle) at your side. 2. With your other hand, gently pull your left / right hand and fingers toward the floor (extension). Stop when you feel a gentle stretch on the palm side of your forearm. 3. Hold this position for __________ seconds. Repeat __________ times. Complete this exercise __________ times a day. Assisted forearm rotation, supination 1. Sit or stand with your left / right elbow bent to a 90-degree angle (right angle) at your side. 2. Using your uninjured hand, turn (rotate) your left / right palm up toward  the ceiling (supination) until you feel a gentle stretch along the inside of your forearm. 3. Hold this position for __________ seconds. Repeat __________ times. Complete this exercise __________ times a day. Assisted forearm rotation, pronation 1. Sit or stand with your left / right elbow bent to a 90-degree angle (right angle) at your side. 2. Using your uninjured hand, rotate your left / right palm down toward the floor (pronation) until you feel a gentle stretch along the outside of your forearm. 3. Hold this position for __________ seconds. Repeat __________ times. Complete this exercise __________ times a day. Strengthening exercises These exercises build strength and endurance in your forearm and elbow. Endurance is the ability to use your muscles for a long time, even after they get tired. Radial deviation  1. Stand with a __________ weight or a hammer in your left / right hand. Or, sit while  holding a rubber exercise band or tubing, with your left / right forearm supported on a table or countertop. ? If you are standing, position your forearm so that your thumb is facing forward. If you are sitting, position your forearm so that the thumb is facing the ceiling. This is the neutral position. 2. Raise your hand upward in front of you so your thumb moves toward the ceiling (radial deviation), or pull up on the rubber tubing. Keep your forearm and elbow still while you move your wrist only. 3. Hold this position for __________ seconds. 4. Slowly return to the starting position. Repeat __________ times. Complete this exercise __________ times a day. Wrist extension, eccentric 1. Sit with your left / right forearm palm-down and supported on a table or other surface. Let your left / right wrist extend over the edge of the surface. 2. Hold a __________ weight or a piece of exercise band or tubing in your left / right hand. ? If using a rubber exercise band or tubing, hold the other end of the tubing with your other hand. 3. Use your uninjured hand to move your left / right hand up toward the ceiling. 4. Take your uninjured hand away and slowly return to the starting position using only your left / right hand. Lowering your arm under tension is called eccentric extension. Repeat __________ times. Complete this exercise __________ times a day. Wrist extension Do not do this exercise if it causes pain at the outside of your elbow. Only do this exercise once instructed by your health care provider. 1. Sit with your left / right forearm supported on a table or other surface and your palm turned down toward the floor. Let your left / right wrist extend over the edge of the surface. 2. Hold a __________ weight or a piece of rubber exercise band or tubing. ? If you are using a rubber exercise band or tubing, hold the band or tubing in place with your other hand to provide resistance. 3. Slowly bend  your wrist so your hand moves up toward the ceiling (extension). Move only your wrist, keeping your forearm and elbow still. 4. Hold this position for __________ seconds. 5. Slowly return to the starting position. Repeat __________ times. Complete this exercise __________ times a day. Forearm rotation, supination To do this exercise, you will need a lightweight hammer or rubber mallet. 1. Sit with your left / right forearm supported on a table or other surface. Bend your elbow to a 90-degree angle (right angle). Position your forearm so that your palm is facing down toward the floor,  with your hand resting over the edge of the table. 2. Hold a hammer in your left / right hand. ? To make this exercise easier, hold the hammer near the head of the hammer. ? To make this exercise harder, hold the hammer near the end of the handle. 3. Without moving your wrist or elbow, slowly rotate your forearm so your palm faces up toward the ceiling (supination). 4. Hold this position for __________ seconds. 5. Slowly return to the starting position. Repeat __________ times. Complete this exercise __________ times a day. Shoulder blade squeeze 1. Sit in a stable chair or stand with good posture. If you are sitting down, do not let your back touch the back of the chair. 2. Your arms should be at your sides with your elbows bent to a 90-degree angle (right angle). Position your forearms so that your thumbs are facing the ceiling (neutral position). 3. Without lifting your shoulders up, squeeze your shoulder blades tightly together. 4. Hold this position for __________ seconds. 5. Slowly release and return to the starting position. Repeat __________ times. Complete this exercise __________ times a day. This information is not intended to replace advice given to you by your health care provider. Make sure you discuss any questions you have with your health care provider. Document Released: 07/18/2005 Document  Revised: 11/08/2018 Document Reviewed: 09/11/2018 Elsevier Patient Education  2020 [employer].

## 2019-07-22 ENCOUNTER — Other Ambulatory Visit: Payer: Self-pay | Admitting: Adult Health

## 2019-07-22 DIAGNOSIS — M545 Low back pain, unspecified: Secondary | ICD-10-CM

## 2019-07-22 MED ORDER — PREDNISONE 20 MG PO TABS: ORAL_TABLET | ORAL | 0 refills | Status: AC

## 2019-07-22 MED ORDER — MELOXICAM 15 MG PO TABS: ORAL_TABLET | ORAL | 1 refills | Status: DC

## 2019-08-14 NOTE — Progress Notes (Deleted)
   Subjective:    Patient ID: Philip Montgomery, male    DOB: 03/05/1961, 59 y.o.   MRN: [id number]  HPI 59 y.o. WM with history of HTN, chol, preDM, hypogonadism, hyperthyroidism, anxiety presents with tinnitus and dizziness.  ? Hearing loss Lab Results  Component Value Date   TSH 0.59 11/26/2018   Neuro imaging: Never  There were no vitals taken for this visit.  Medications  Current Outpatient Medications (Endocrine & Metabolic):  .  dexamethasone (DECADRON) 0.5 MG/5ML solution, Take 0.5 mg by mouth 4 (four) times daily as needed. Oral rinse for kanker sores as needed .  Testosterone (ANDROGEL PUMP) 20.25 MG/ACT (1.62%) GEL, APPLY 2 PUMPS TO THE SKIN DAILY  Current Outpatient Medications (Cardiovascular):  .  bisoprolol-hydrochlorothiazide (ZIAC) 5-6.25 MG tablet, Take 1 tablet Daily for BP .  lisinopril (ZESTRIL) 10 MG tablet, Take 1 tablet Daily for BP .  rosuvastatin (CRESTOR) 40 MG tablet, Take 1 tablet Daily for Cholesterol .  sildenafil (VIAGRA) 100 MG tablet, 1/2-1 pill as needed 1 hour before intercourse, can get with good RX from [facility]   Current Outpatient Medications (Analgesics):  .  meloxicam (MOBIC) 15 MG tablet, Take one daily with food for 2 weeks, can take with tylenol, can not take with aleve, iburpofen, then as needed daily for pain   Current Outpatient Medications (Other):  Marland Kitchen  ALPRAZolam (XANAX) 0.5 MG tablet, Take 1 tab as needed 30 min prior to OV/blood draw. .  Cholecalciferol (VITAMIN D3) 5000 units CAPS, Take 2 capsules by mouth daily. .  Multiple Vitamins-Minerals (MULTIVITAMIN PO), Take by mouth. .  Omega 3-6-9 Fatty Acids (OMEGA 3-6-9 COMPLEX PO), Take by mouth every evening.  .  sertraline (ZOLOFT) 100 MG tablet, Take 1 tablet Daily for Mood .  traZODone (DESYREL) 50 MG tablet, 1/2-2 tablet for sleep 1 hour prior to bedtime. .  Zinc 50 MG TABS, Take 1 tablet by mouth every evening.  Problem list He has Hyperlipidemia, mixed; Hypertension; CKD  (chronic kidney disease), symptom management only, stage 2 (mild); Insomnia; HSV-1 (herpes simplex virus 1) infection; Testosterone deficiency; DDD (degenerative disc disease), lumbar; Insulin resistance; Vitamin D deficiency; Other abnormal glucose; Medication management; Overweight (BMI 25.0-29.9); Low TSH level; and Anxiety on their problem list.   Review of Systems     Objective:   Physical Exam        Assessment & Plan:

## 2019-08-15 ENCOUNTER — Ambulatory Visit: Payer: BC Managed Care – PPO | Admitting: Physician Assistant

## 2019-08-15 ENCOUNTER — Other Ambulatory Visit: Payer: Self-pay

## 2019-08-15 ENCOUNTER — Encounter: Payer: Self-pay | Admitting: Physician Assistant

## 2019-08-15 VITALS — BP 118/68 | HR 82 | Temp 97.2°F | Wt 165.2 lb

## 2019-08-15 DIAGNOSIS — G44219 Episodic tension-type headache, not intractable: Secondary | ICD-10-CM | POA: Diagnosis not present

## 2019-08-15 DIAGNOSIS — H9193 Unspecified hearing loss, bilateral: Secondary | ICD-10-CM | POA: Diagnosis not present

## 2019-08-15 DIAGNOSIS — H93A9 Pulsatile tinnitus, unspecified ear: Secondary | ICD-10-CM

## 2019-08-15 NOTE — Progress Notes (Signed)
Subjective:    Patient ID: Philip Montgomery, male    DOB: Sep 03, 1960, 59 y.o.   MRN: [id number]  HPI 59 y.o. WM HTN, chol, anxiety presents with tinnitus and dizziness. Tinnitus started 5-6 weeks ago that is constant worse with quiet, he will hear a wave noise linked with his pulse better with caffiene, he has dizziness/vertigo describes it very brief and states it feels like his head "jump/jar", very brief, intermittent. Feels it is "hard to think", he has been having occipital headaches. . Bilateral hearing loss.  Nothing has changed other than he has cut out red meat and ate more veggies. He tried to cut back on his BP meds and the dizziness did not get better.   No history of headaches and migraines.   Blood pressure 118/68, pulse 82, temperature (!) 97.2 F (36.2 C), weight 165 lb 3.2 oz (74.9 kg), SpO2 97 %.  Medications  Current Outpatient Medications (Endocrine & Metabolic):  .  dexamethasone (DECADRON) 0.5 MG/5ML solution, Take 0.5 mg by mouth 4 (four) times daily as needed. Oral rinse for kanker sores as needed .  Testosterone (ANDROGEL PUMP) 20.25 MG/ACT (1.62%) GEL, APPLY 2 PUMPS TO THE SKIN DAILY  Current Outpatient Medications (Cardiovascular):  .  bisoprolol-hydrochlorothiazide (ZIAC) 5-6.25 MG tablet, Take 1 tablet Daily for BP .  lisinopril (ZESTRIL) 10 MG tablet, Take 1 tablet Daily for BP .  rosuvastatin (CRESTOR) 40 MG tablet, Take 1 tablet Daily for Cholesterol .  sildenafil (VIAGRA) 100 MG tablet, 1/2-1 pill as needed 1 hour before intercourse, can get with good RX from [facility]     Current Outpatient Medications (Other):  Marland Kitchen  Cholecalciferol (VITAMIN D3) 5000 units CAPS, Take 2 capsules by mouth daily. .  Multiple Vitamins-Minerals (MULTIVITAMIN PO), Take by mouth. .  Omega 3-6-9 Fatty Acids (OMEGA 3-6-9 COMPLEX PO), Take by mouth every evening.  .  Zinc 50 MG TABS, Take 1 tablet by mouth every evening.  Problem list He has Hyperlipidemia, mixed;  Hypertension; CKD (chronic kidney disease), symptom management only, stage 2 (mild); Insomnia; HSV-1 (herpes simplex virus 1) infection; Testosterone deficiency; DDD (degenerative disc disease), lumbar; Insulin resistance; Vitamin D deficiency; Other abnormal glucose; Medication management; Overweight (BMI 25.0-29.9); Low TSH level; and Anxiety on their problem list.   Review of Systems  Constitutional: Negative.  Negative for chills and fever.  HENT: Positive for hearing loss and tinnitus. Negative for congestion, ear pain, facial swelling, postnasal drip, rhinorrhea, sinus pressure, sinus pain, sore throat, trouble swallowing and voice change.   Respiratory: Negative.  Negative for shortness of breath.   Cardiovascular: Negative.  Negative for chest pain.  Gastrointestinal: Negative.  Negative for diarrhea.  Genitourinary: Negative.   Musculoskeletal: Negative.  Negative for neck pain.  Neurological: Positive for dizziness. Negative for tremors, seizures, syncope, facial asymmetry, speech difficulty, weakness, light-headedness, numbness and headaches.  Psychiatric/Behavioral: Negative.  Negative for hallucinations.       Objective:   Physical Exam Constitutional:      Appearance: He is well-developed.  HENT:     Head: Normocephalic and atraumatic.     Right Ear: External ear normal.     Left Ear: External ear normal.  Eyes:     General: No visual field deficit.    Conjunctiva/sclera: Conjunctivae normal.     Pupils: Pupils are equal, round, and reactive to light.  Cardiovascular:     Rate and Rhythm: Normal rate and regular rhythm.     Heart sounds: Normal heart sounds.  Pulmonary:     Effort: Pulmonary effort is normal.     Breath sounds: Normal breath sounds.  Abdominal:     General: Bowel sounds are normal.     Palpations: Abdomen is soft.  Musculoskeletal:        General: Normal range of motion.     Cervical back: Normal range of motion and neck supple.  Skin:     General: Skin is warm and dry.  Neurological:     Mental Status: He is alert and oriented to person, place, and time.     Cranial Nerves: No cranial nerve deficit, dysarthria or facial asymmetry.     Motor: Motor function is intact. No pronator drift.     Coordination: Coordination is intact. Romberg sign negative. Finger-Nose-Finger Test normal. Rapid alternating movements normal.     Gait: Gait is intact.     Comments: Negative Harland Dingwall bilaterally  Psychiatric:        Behavior: Behavior normal.           Assessment & Plan:  Philip Montgomery was seen today for acute visit, tinnitus, dizziness and headache x 5 weeks:  Tinnitus, Headache, bilateral hearing loss -     Ambulatory referral to ENT - normal neuro in the office- discussed an MRI but at this time felt it was not needed due to bilateral symptoms and normal neuro - bilateral hearing loss, bilateral tinnitus, non positional vertigo  The patient was advised to call immediately if he has any concerning symptoms in the interval. The patient voices understanding of current treatment options and is in agreement with the current care plan.The patient knows to call the clinic with any problems, questions or concerns or go to the ER if any further progression of symptoms.

## 2019-08-15 NOTE — Patient Instructions (Signed)
He was informed to call 911 if he develop any new symptoms such as worsening headaches, episodes of blurred vision, double vision or complete loss of vision or speech difficulties or motor weakness. Vertigo gets worse, imbalance.    Tinnitus Tinnitus refers to hearing a sound when there is no actual source for that sound. This is often described as ringing in the ears. However, people with this condition may hear a variety of noises, in one ear or in both ears. The sounds of tinnitus can be soft, loud, or somewhere in between. Tinnitus can last for a few seconds or can be constant for days. It may go away without treatment and come back at various times. When tinnitus is constant or happens often, it can lead to other problems, such as trouble sleeping and trouble concentrating. Almost everyone experiences tinnitus at some point. Tinnitus that is long-lasting (chronic) or comes back often (recurs) may require medical attention. What are the causes? The cause of tinnitus is often not known. In some cases, it can result from:  Exposure to loud noises from machinery, music, or other sources.  An object (foreign body) stuck in the ear.  Earwax buildup.  Drinking alcohol or caffeine.  Taking certain medicines.  Age-related hearing loss. It may also be caused by medical conditions such as:  Ear or sinus infections.  High blood pressure.  Heart diseases.  Anemia.  Allergies.  Meniere's disease.  Thyroid problems.  Tumors.  A weak, bulging blood vessel (aneurysm) near the ear. What are the signs or symptoms? The main symptom of tinnitus is hearing a sound when there is no source for that sound. It may sound like:  Buzzing.  Roaring.  Ringing.  Blowing air.  Hissing.  Whistling.  Sizzling.  Humming.  Running water.  A musical note.  Tapping. Symptoms may affect only one ear (unilateral) or both ears (bilateral). How is this diagnosed? Tinnitus is diagnosed  based on your symptoms, your medical history, and a physical exam. Your health care provider may do a thorough hearing test (audiologic exam) if your tinnitus:  Is unilateral.  Causes hearing difficulties.  Lasts 6 months or longer. You may work with a health care provider who specializes in hearing disorders (audiologist). You may be asked questions about your symptoms and how they affect your daily life. You may have other tests done, such as:  CT scan.  MRI.  An imaging test of how blood flows through your blood vessels (angiogram). How is this treated? Treating an underlying medical condition can sometimes make tinnitus go away. If your tinnitus continues, other treatments may include:  Medicines.  Therapy and counseling to help you manage the stress of living with tinnitus.  Sound generators to mask the tinnitus. These include: ? Tabletop sound machines that play relaxing sounds to help you fall asleep. ? Wearable devices that fit in your ear and play sounds or music. ? Acoustic neural stimulation. This involves using headphones to listen to music that contains an auditory signal. Over time, listening to this signal may change some pathways in your brain and make you less sensitive to tinnitus. This treatment is used for very severe cases when no other treatment is working.  Using hearing aids or cochlear implants if your tinnitus is related to hearing loss. Hearing aids are worn in the outer ear. Cochlear implants are surgically placed in the inner ear. Follow these instructions at home: Managing symptoms      When possible, avoid being in  loud places and being exposed to loud sounds.  Wear hearing protection, such as earplugs, when you are exposed to loud noises.  Use a white noise machine, a humidifier, or other devices to mask the sound of tinnitus.  Practice techniques for reducing stress, such as meditation, yoga, or deep breathing. Work with your health care  provider if you need help with managing stress.  Sleep with your head slightly raised. This may reduce the impact of tinnitus. General instructions  Do not use stimulants, such as nicotine, alcohol, or caffeine. Talk with your health care provider about other stimulants to avoid. Stimulants are substances that can make you feel alert and attentive by increasing certain activities in the body (such as heart rate and blood pressure). These substances may make tinnitus worse.  Take over-the-counter and prescription medicines only as told by your health care provider.  Try to get plenty of sleep each night.  Keep all follow-up visits as told by your health care provider. This is important. Contact a health care provider if:  Your tinnitus continues for 3 weeks or longer without stopping.  You develop sudden hearing loss.  Your symptoms get worse or do not get better with home care.  You feel you are not able to manage the stress of living with tinnitus. Get help right away if:  You develop tinnitus after a head injury.  You have tinnitus along with any of the following: ? Dizziness. ? Loss of balance. ? Nausea and vomiting. ? Sudden, severe headache. These symptoms may represent a serious problem that is an emergency. Do not wait to see if the symptoms will go away. Get medical help right away. Call your local emergency services (911 in the [place] Do not drive yourself to the hospital. Summary  Tinnitus refers to hearing a sound when there is no actual source for that sound. This is often described as ringing in the ears.  Symptoms may affect only one ear (unilateral) or both ears (bilateral).  Use a white noise machine, a humidifier, or other devices to mask the sound of tinnitus.  Do not use stimulants, such as nicotine, alcohol, or caffeine. Talk with your health care provider about other stimulants to avoid. These substances may make tinnitus worse. This information is not  intended to replace advice given to you by your health care provider. Make sure you discuss any questions you have with your health care provider. Document Revised: 01/30/2019 Document Reviewed: 04/27/2017 Elsevier Patient Education  2020 [employer].

## 2019-08-16 ENCOUNTER — Ambulatory Visit: Payer: Self-pay | Admitting: Physician Assistant

## 2019-08-21 ENCOUNTER — Encounter: Payer: Self-pay | Admitting: Physician Assistant

## 2019-08-29 ENCOUNTER — Ambulatory Visit (INDEPENDENT_AMBULATORY_CARE_PROVIDER_SITE_OTHER): Payer: BLUE CROSS/BLUE SHIELD | Admitting: Otolaryngology

## 2019-09-02 DIAGNOSIS — D2271 Melanocytic nevi of right lower limb, including hip: Secondary | ICD-10-CM | POA: Diagnosis not present

## 2019-09-02 DIAGNOSIS — L570 Actinic keratosis: Secondary | ICD-10-CM | POA: Diagnosis not present

## 2019-09-02 DIAGNOSIS — B078 Other viral warts: Secondary | ICD-10-CM | POA: Diagnosis not present

## 2019-09-02 DIAGNOSIS — L821 Other seborrheic keratosis: Secondary | ICD-10-CM | POA: Diagnosis not present

## 2019-09-05 ENCOUNTER — Other Ambulatory Visit: Payer: Self-pay

## 2019-09-05 ENCOUNTER — Encounter (INDEPENDENT_AMBULATORY_CARE_PROVIDER_SITE_OTHER): Payer: Self-pay | Admitting: Otolaryngology

## 2019-09-05 ENCOUNTER — Ambulatory Visit (INDEPENDENT_AMBULATORY_CARE_PROVIDER_SITE_OTHER): Payer: BC Managed Care – PPO | Admitting: Otolaryngology

## 2019-09-05 VITALS — Temp 97.9°F

## 2019-09-05 DIAGNOSIS — H9313 Tinnitus, bilateral: Secondary | ICD-10-CM

## 2019-09-05 DIAGNOSIS — H903 Sensorineural hearing loss, bilateral: Secondary | ICD-10-CM

## 2019-09-05 NOTE — Progress Notes (Signed)
HPI: Philip Montgomery is a 59 y.o. male who presents is referred by his PCP for evaluation of chronic tinnitus.  He has some mild difficulty hearing his son and wife clearly at times.  He also has a lot more trouble hearing when there is background noise.  He works at a [employer]. He describes the tinnitus as high-pitched and is worse when it is quiet. He is status post previous septal surgery as well as tonsillectomy  Past Medical History:  Diagnosis Date  . DDD (degenerative disc disease), lumbar   . Hyperlipidemia, mixed   . Hypertension   . Hypogonadism male   . Penile wart   . Vitamin D deficiency   . Wears contact lenses    Past Surgical History:  Procedure Laterality Date  . CO2 LASER APPLICATION  02/27/2017   Procedure: CO2 LASER APPLICATION;  Surgeon: Hildred Laser, MD;  Location: [facility];  Service: Urology;;  . INGUINAL HERNIA REPAIR Left 2012  approx.   mesh  . LUMBAR MICRODISCECTOMY  12/29/2004   right L4--5  . PENILE BIOPSY N/A 02/27/2017   Procedure: SUPRAPUBIC  EXCISION OF  WARTS, CO2 LASER OF PENILE WARTS;  Surgeon: Hildred Laser, MD;  Location: [facility];  Service: Urology;  Laterality: N/A;   Social History   Socioeconomic History  . Marital status: Married    Spouse name: Not on file  . Number of children: Not on file  . Years of education: Not on file  . Highest education level: Not on file  Occupational History  . Not on file  Tobacco Use  . Smoking status: Never Smoker  . Smokeless tobacco: Never Used  Substance and Sexual Activity  . Alcohol use: Yes    Alcohol/week: 0.0 standard drinks    Comment: rare, 2-3 x a month if that  . Drug use: No  . Sexual activity: Not on file  Other Topics Concern  . Not on file  Social History Narrative  . Not on file   Social Determinants of Health   Financial Resource Strain:   . Difficulty of Paying Living Expenses: Not on file  Food Insecurity:   . Worried About  Programme researcher, broadcasting/film/video in the Last Year: Not on file  . Ran Out of Food in the Last Year: Not on file  Transportation Needs:   . Lack of Transportation (Medical): Not on file  . Lack of Transportation (Non-Medical): Not on file  Physical Activity:   . Days of Exercise per Week: Not on file  . Minutes of Exercise per Session: Not on file  Stress:   . Feeling of Stress : Not on file  Social Connections:   . Frequency of Communication with Friends and Family: Not on file  . Frequency of Social Gatherings with Friends and Family: Not on file  . Attends Religious Services: Not on file  . Active Member of Clubs or Organizations: Not on file  . Attends Banker Meetings: Not on file  . Marital Status: Not on file   Family History  Problem Relation Age of Onset  . Hypotension Mother   . Cancer Father 9       colon cancer  . Diabetes Brother    No Known Allergies Prior to Admission medications   Medication Sig Start Date End Date Taking? Authorizing Provider  bisoprolol-hydrochlorothiazide [facility]) 5-6.25 MG tablet Take 1 tablet Daily for BP 01/17/19  Yes Lucky Cowboy, MD  Cholecalciferol (VITAMIN  D3) 5000 units CAPS Take 2 capsules by mouth daily.   Yes [provider]  dexamethasone (DECADRON) 0.5 MG/5ML solution Take 0.5 mg by mouth 4 (four) times daily as needed. Oral rinse for kanker sores as needed   Yes [provider]  lisinopril (ZESTRIL) 10 MG tablet Take 1 tablet Daily for BP 03/03/19  Yes Unk Pinto, MD  Multiple Vitamins-Minerals (MULTIVITAMIN PO) Take by mouth.   Yes [provider]  Omega 3-6-9 Fatty Acids (OMEGA 3-6-9 COMPLEX PO) Take by mouth every evening.    Yes [provider]  rosuvastatin (CRESTOR) 40 MG tablet Take 1 tablet Daily for Cholesterol 07/12/19  Yes Liane Comber, NP  sildenafil (VIAGRA) 100 MG tablet 1/2-1 pill as needed 1 hour before intercourse, can get with good RX from [facility] 08/02/17  Yes  Vicie Mutters, PA-C  Testosterone (ANDROGEL PUMP) 20.25 MG/ACT (1.62%) GEL APPLY 2 PUMPS TO THE SKIN DAILY 03/04/19  Yes Unk Pinto, MD  Zinc 50 MG TABS Take 1 tablet by mouth every evening.   Yes [provider]     Positive ROS: Otherwise negative  All other systems have been reviewed and were otherwise negative with the exception of those mentioned in the HPI and as above.  Physical Exam: Constitutional: Alert, well-appearing, no acute distress Ears: External ears without lesions or tenderness. Ear canals are clear bilaterally with intact, clear TMs.  On auscultation of the ears there is no objective tinnitus or sounds. Nasal: External nose without lesions. Septum relatively midline.. Clear nasal passages. Oral: Lips and gums without lesions. Tongue and palate mucosa without lesions. Posterior oropharynx clear. Neck: No palpable adenopathy or masses Respiratory: Breathing comfortably  Skin: No facial/neck lesions or rash noted.  Audiogram in the office today demonstrated normal hearing up to 1000 frequency.  Then in the left ear he has a mild decreased hearing in the upper frequencies down to about 35 DB.  In the right ear the drop in hearing begins about 2000 frequency again down to about 35-40 DB. His SRT's were 10dB in the right ear and 15 dB in the left ear.  He had type A tympanograms bilaterally.  Procedures  Assessment: Mild upper frequency sensorineural hearing loss in both ears with secondary tinnitus.  Plan: Treatment options are limited but discussed with him concerning using masking noise. Also gave him samples of Lipo flavonoid to try as this is beneficial in some people. Also cautioned him about using ear protection when around loud noise. If he is having difficulty hearing people adequately he could consider use of hearing aids.  However his main complaint is treatment of the tinnitus.   Radene Journey, MD   CC:

## 2019-09-06 ENCOUNTER — Encounter (INDEPENDENT_AMBULATORY_CARE_PROVIDER_SITE_OTHER): Payer: Self-pay

## 2019-09-12 DIAGNOSIS — M25529 Pain in unspecified elbow: Secondary | ICD-10-CM | POA: Diagnosis not present

## 2019-09-25 DIAGNOSIS — M25529 Pain in unspecified elbow: Secondary | ICD-10-CM | POA: Diagnosis not present

## 2019-10-07 ENCOUNTER — Encounter: Payer: Self-pay | Admitting: Physician Assistant

## 2019-10-18 DIAGNOSIS — D044 Carcinoma in situ of skin of scalp and neck: Secondary | ICD-10-CM | POA: Diagnosis not present

## 2019-10-18 DIAGNOSIS — L570 Actinic keratosis: Secondary | ICD-10-CM | POA: Diagnosis not present

## 2019-10-18 DIAGNOSIS — L821 Other seborrheic keratosis: Secondary | ICD-10-CM | POA: Diagnosis not present

## 2019-10-22 DIAGNOSIS — R7989 Other specified abnormal findings of blood chemistry: Secondary | ICD-10-CM | POA: Diagnosis not present

## 2019-10-22 DIAGNOSIS — E7800 Pure hypercholesterolemia, unspecified: Secondary | ICD-10-CM | POA: Diagnosis not present

## 2019-10-22 DIAGNOSIS — N529 Male erectile dysfunction, unspecified: Secondary | ICD-10-CM | POA: Diagnosis not present

## 2019-10-22 DIAGNOSIS — Z125 Encounter for screening for malignant neoplasm of prostate: Secondary | ICD-10-CM | POA: Diagnosis not present

## 2019-10-22 DIAGNOSIS — M25521 Pain in right elbow: Secondary | ICD-10-CM | POA: Diagnosis not present

## 2019-10-22 DIAGNOSIS — E291 Testicular hypofunction: Secondary | ICD-10-CM | POA: Diagnosis not present

## 2019-10-22 DIAGNOSIS — I10 Essential (primary) hypertension: Secondary | ICD-10-CM | POA: Diagnosis not present

## 2019-10-22 DIAGNOSIS — Z0000 Encounter for general adult medical examination without abnormal findings: Secondary | ICD-10-CM | POA: Diagnosis not present

## 2019-10-22 DIAGNOSIS — R946 Abnormal results of thyroid function studies: Secondary | ICD-10-CM | POA: Diagnosis not present

## 2019-11-22 DIAGNOSIS — E7800 Pure hypercholesterolemia, unspecified: Secondary | ICD-10-CM | POA: Diagnosis not present

## 2019-11-22 DIAGNOSIS — Z23 Encounter for immunization: Secondary | ICD-10-CM | POA: Diagnosis not present

## 2019-11-22 DIAGNOSIS — E291 Testicular hypofunction: Secondary | ICD-10-CM | POA: Diagnosis not present

## 2019-11-22 DIAGNOSIS — I10 Essential (primary) hypertension: Secondary | ICD-10-CM | POA: Diagnosis not present

## 2019-11-25 DIAGNOSIS — D044 Carcinoma in situ of skin of scalp and neck: Secondary | ICD-10-CM | POA: Diagnosis not present

## 2019-12-06 DIAGNOSIS — R944 Abnormal results of kidney function studies: Secondary | ICD-10-CM | POA: Diagnosis not present

## 2020-08-20 ENCOUNTER — Encounter: Payer: Self-pay | Admitting: Physician Assistant

## 2021-10-29 ENCOUNTER — Ambulatory Visit: Payer: BC Managed Care – PPO | Admitting: Internal Medicine

## 2021-10-29 ENCOUNTER — Encounter: Payer: Self-pay | Admitting: Internal Medicine

## 2021-10-29 VITALS — BP 120/68 | HR 85 | Ht 66.0 in | Wt 157.2 lb

## 2021-10-29 DIAGNOSIS — E042 Nontoxic multinodular goiter: Secondary | ICD-10-CM | POA: Insufficient documentation

## 2021-10-29 DIAGNOSIS — E0590 Thyrotoxicosis, unspecified without thyrotoxic crisis or storm: Secondary | ICD-10-CM | POA: Insufficient documentation

## 2021-10-29 LAB — CBC
HCT: 47.4 % (ref 39.0–52.0)
Hemoglobin: 15.8 g/dL (ref 13.0–17.0)
MCHC: 33.3 g/dL (ref 30.0–36.0)
MCV: 94.3 fl (ref 78.0–100.0)
Platelets: 232 10*3/uL (ref 150.0–400.0)
RBC: 5.03 Mil/uL (ref 4.22–5.81)
RDW: 13 % (ref 11.5–15.5)
WBC: 7.9 10*3/uL (ref 4.0–10.5)

## 2021-10-29 LAB — COMPREHENSIVE METABOLIC PANEL
ALT: 28 U/L (ref 0–53)
AST: 21 U/L (ref 0–37)
Albumin: 4.9 g/dL (ref 3.5–5.2)
Alkaline Phosphatase: 63 U/L (ref 39–117)
BUN: 22 mg/dL (ref 6–23)
CO2: 32 mEq/L (ref 19–32)
Calcium: 10.7 mg/dL — ABNORMAL HIGH (ref 8.4–10.5)
Chloride: 101 mEq/L (ref 96–112)
Creatinine, Ser: 1.27 mg/dL (ref 0.40–1.50)
GFR: 60.33 mL/min (ref >60.00)
Glucose, Bld: 77 mg/dL (ref 70–99)
Potassium: 4.8 mEq/L (ref 3.5–5.1)
Sodium: 139 mEq/L (ref 135–145)
Total Bilirubin: 0.6 mg/dL (ref 0.2–1.2)
Total Protein: 6.9 g/dL (ref 6.0–8.3)

## 2021-10-29 LAB — T4, FREE: Free T4: 0.74 ng/dL (ref 0.60–1.60)

## 2021-10-29 LAB — TSH: TSH: 0.66 u[IU]/mL (ref 0.35–5.50)

## 2021-10-29 NOTE — Progress Notes (Signed)
? ? ?Name: Philip Montgomery  ?MRN/ DOB: [id number], 05-08-1961    ?Age/ Sex: 61 y.o., male   ? ?PCP: Pcp, No   ?Reason for Endocrinology Evaluation: Low TSH  ?   ?Date of Initial Endocrinology Evaluation: 10/29/2021   ? ? ?HPI: ?Philip Montgomery is a 62 y.o. male with a past medical history of HTN, dyslipidemia, and hypogonadism. Philip Montgomery presented for initial endocrinology clinic visit on 10/29/2021 for consultative assistance with his low TSH.  ? ?Philip Montgomery has been noted with low TSH between 02/17/2018 and 05/20/2018 with a nadir of 0.24u IU/mL, no concomitant T4 or T3. ? ?His most recent lab work was on 05/05/2021 with a TSH of 0.273 u IU/mL ? ?Anti-TPO 13 IU/mL ( 0-34)  ? ? ?He calls seeing an endocrinologist many years ago for MNG , with history of biopsies . ? ?Denies weight loss  ? ? ? ?He was on prednisone for back issues for a couple of weeks ! 1 month ago  ? ?Denies palpitations  ?Has occasional soft stools but no diarrhea  ?Denies local neck symptoms  ?Has anxiety ?No biotin use   ? ?No hx of cardiac arrhythmia  ?No Hx of osteoporosis  ?No Fh of thyroid disease  ? ? ? ?HISTORY:  ?Past Medical History:  ?Past Medical History:  ?Diagnosis Date  ? DDD (degenerative disc disease), lumbar   ? Hyperlipidemia, mixed   ? Hypertension   ? Hypogonadism male   ? Penile wart   ? Vitamin D deficiency   ? Wears contact lenses   ? ?Past Surgical History:  ?Past Surgical History:  ?Procedure Laterality Date  ? CO2 LASER APPLICATION  02/27/2017  ? Procedure: CO2 LASER APPLICATION;  Surgeon: Hildred Laser, MD;  [facility];  Service: Urology;;  ? INGUINAL HERNIA REPAIR Left 2012  approx.  ? mesh  ? LUMBAR MICRODISCECTOMY  12/29/2004  ? right L4--5  ? PENILE BIOPSY N/A 02/27/2017  ? Procedure: SUPRAPUBIC  EXCISION OF  WARTS, CO2 LASER OF PENILE WARTS;  Surgeon: Hildred Laser, MD;  Location: [facility];  Service: Urology;  Laterality: N/A;  ?  ?Social History:  reports that he has  never smoked. He has never used smokeless tobacco. He reports current alcohol use. He reports that he does not use drugs. ?Family History: family history includes Cancer (age of onset: 15) in his father; Diabetes in his brother; Hypotension in his mother. ? ? ?HOME MEDICATIONS: ?Allergies as of 10/29/2021   ?No Known Allergies ?  ? ?  ?Medication List  ?  ? ?  ? Accurate as of October 29, 2021 12:41 PM. If you have any questions, ask your nurse or doctor.  ?  ?  ? ?  ? ?STOP taking these medications   ? ?bisoprolol-hydrochlorothiazide 5-6.25 MG tablet ?Commonly known as: ZIAC ?Stopped by: Scarlette Shorts, MD ?  ?dexamethasone 0.5 MG/5ML solution ?Commonly known as: DECADRON ?Stopped by: Scarlette Shorts, MD ?  ?sildenafil 100 MG tablet ?Commonly known as: VIAGRA ?Stopped by: Scarlette Shorts, MD ?  ?Testosterone 20.25 MG/ACT (1.62%) Gel ?Commonly known as: AndroGel Pump ?Stopped by: Scarlette Shorts, MD ?  ? ?  ? ?TAKE these medications   ? ?citalopram 20 MG tablet ?Commonly known as: CELEXA ?Take 20 mg by mouth daily. ?  ?lisinopril 10 MG tablet ?Commonly known as: ZESTRIL ?Take 1 tablet Daily for BP ?What changed: how much to take ?  ?MULTIVITAMIN PO ?  Take by mouth. ?  ?OMEGA 3-6-9 COMPLEX PO ?Take by mouth every evening. ?  ?rosuvastatin 40 MG tablet ?Commonly known as: CRESTOR ?Take 1 tablet Daily for Cholesterol ?  ?Vitamin D (Ergocalciferol) 1.25 MG (50000 UNIT) Caps capsule ?Commonly known as: DRISDOL ?Take 50,000 Units by mouth every 7 (seven) days. ?  ?Vitamin D3 125 MCG (5000 UT) Caps ?Take 2 capsules by mouth daily. ?  ?Zinc 50 MG Tabs ?Take 1 tablet by mouth every evening. ?  ?zolpidem 10 MG tablet ?Commonly known as: AMBIEN ?Take 10 mg by mouth at bedtime as needed for sleep. ?  ? ?  ?  ? ? ?REVIEW OF SYSTEMS: ?A comprehensive ROS was conducted with Philip Montgomery and is negative except as per HPI  ? ? ? ?OBJECTIVE:  ?VS: BP 120/68 (BP Location: Left Arm, Montgomery Position: Sitting, Cuff Size:  Normal)   Pulse 85   Ht 5\' 6"  (1.676 m)   Wt 157 lb 3.2 oz (71.3 kg)   SpO2 98%   BMI 25.37 kg/m?   ? ?Wt Readings from Last 3 Encounters:  ?10/29/21 157 lb 3.2 oz (71.3 kg)  ?08/15/19 165 lb 3.2 oz (74.9 kg)  ?07/12/19 163 lb 6.4 oz (74.1 kg)  ? ? ? ?EXAM: ?General: Pt appears well and is in NAD  ?Neck: General: Supple without adenopathy. ?Thyroid: Thyroid size normal.  Minimal right asymmetry noted on exam. No thyroid bruit.  ?Lungs: Clear with good BS bilat with no rales, rhonchi, or wheezes  ?Heart: Auscultation: RRR.  ?Abdomen: Normoactive bowel sounds, soft, nontender, without masses or organomegaly palpable  ?Extremities:  ?BL LE: No pretibial edema normal ROM and strength.  ?Mental Status: Judgment, insight: Intact ?Orientation: Oriented to time, place, and person ?Mood and affect: No depression, anxiety, or agitation  ? ? ? ?DATA REVIEWED: ? Latest Reference Range & Units 10/29/21 08:09  ?Sodium 135 - 145 mEq/L 139  ?Potassium 3.5 - 5.1 mEq/L 4.8  ?Chloride 96 - 112 mEq/L 101  ?CO2 19 - 32 mEq/L 32  ?Glucose 70 - 99 mg/dL 77  ?BUN 6 - 23 mg/dL 22  ?Creatinine 0.40 - 1.50 mg/dL 7.821.27  ?Calcium 8.4 - 10.5 mg/dL 95.610.7 (H)  ?Alkaline Phosphatase 39 - 117 U/L 63  ?Albumin 3.5 - 5.2 g/dL 4.9  ?AST 0 - 37 U/L 21  ?ALT 0 - 53 U/L 28  ?Total Protein 6.0 - 8.3 g/dL 6.9  ?Total Bilirubin 0.2 - 1.2 mg/dL 0.6  ?GFR >60.00 mL/min 60.33  ? ? Latest Reference Range & Units 10/29/21 08:09  ?WBC 4.0 - 10.5 K/uL 7.9  ?RBC 4.22 - 5.81 Mil/uL 5.03  ?Hemoglobin 13.0 - 17.0 g/dL 21.315.8  ?HCT 39.0 - 52.0 % 47.4  ?MCV 78.0 - 100.0 fl 94.3  ?MCHC 30.0 - 36.0 g/dL 08.633.3  ?RDW 11.5 - 15.5 % 13.0  ?Platelets 150.0 - 400.0 K/uL 232.0  ? ? Latest Reference Range & Units 10/29/21 08:09  ?TSH 0.35 - 5.50 uIU/mL 0.66  ?T4,Free(Direct) 0.60 - 1.60 ng/dL 5.780.74  ? ? ? ?  ? ?ASSESSMENT/PLAN/RECOMMENDATIONS:  ? ?Subclinical hyperthyroidism: ? ? ?-Philip causes of subclinical hyperthyroidism are Philip same as Philip causes of overt hyperthyroidism, and  like overt hyperthyroidism, subclinical hyperthyroidism can be persistent or transient. Common causes of subclinical hyperthyroidism include, autonomously functioning thyroid adenomas and multinodular goiters , or Graves' disease  ?  ?-Most patients with subclinical hyperthyroidism have no clinical manifestations of hyperthyroidism, and those symptoms that are present (eg, tachycardia, tremor, dyspnea on  exertion, weight loss) are mild and nonspecific." However, subclinical hyperthyroidism is associated with an increased risk of atrial fibrillation and, primarily in postmenopausal women, a decrease in bone mineral density. ? ?-Repeat TFTs today are normal ? ? ?-We extensively discussed Philip various treatment options for hyperthyroidism  including ablation therapy with radioactive iodine versus antithyroid drug treatment versus surgical therapy.  ? ?-I carefully explained to Philip Montgomery that one of Philip consequences of I-131 ablation treatment would likely be permanent hypothyroidism which would require long-term replacement therapy with LT4. ? ? ? ? ?2.Multinodular Goiter: ? ? ?-I do suspect this is Philip cause for hyperthyroid ?-No local neck symptoms ?-Per Montgomery he has had a history of multinodular goiter, status post FNA, results are not available ?-We will proceed with thyroid ultrasound ? ? ?Follow-up in 5 months ? ? ? ?Signed electronically by: ?Abby Raelyn Mora, MD ? ?[facility]  ?[facility] ?[address] ?[place], [place] [address] ?Phone: [phone] ?FAX: [phone] ? ? ?CC: ?Pcp, No ?No address on file ?Phone: None ?Fax: None ? ? ?Return to Endocrinology clinic as below: ?Future Appointments  ?Date Time Provider Department Center  ?04/05/2022  8:10 AM Tysha Grismore, Konrad Dolores, MD [id number] None  ?  ? ? ? ? ? ?

## 2021-11-02 LAB — T3: T3, Total: 127 ng/dL (ref 76–181)

## 2021-11-02 LAB — TRAB (TSH RECEPTOR BINDING ANTIBODY): TRAB: <1 IU/L (ref <=2.00)

## 2021-11-03 ENCOUNTER — Other Ambulatory Visit: Payer: BC Managed Care – PPO

## 2021-11-16 ENCOUNTER — Other Ambulatory Visit: Payer: BC Managed Care – PPO

## 2021-11-22 ENCOUNTER — Ambulatory Visit
Admission: RE | Admit: 2021-11-22 | Discharge: 2021-11-22 | Disposition: A | Discharge status: Home / Self Care | Payer: BC Managed Care – PPO | Source: Ambulatory Visit | Attending: Internal Medicine | Admitting: Internal Medicine

## 2021-11-22 DIAGNOSIS — E0590 Thyrotoxicosis, unspecified without thyrotoxic crisis or storm: Secondary | ICD-10-CM

## 2021-11-23 ENCOUNTER — Encounter: Payer: Self-pay | Admitting: Internal Medicine

## 2021-11-23 DIAGNOSIS — E042 Nontoxic multinodular goiter: Secondary | ICD-10-CM

## 2021-12-21 ENCOUNTER — Other Ambulatory Visit: Payer: BC Managed Care – PPO

## 2022-01-11 ENCOUNTER — Other Ambulatory Visit (HOSPITAL_COMMUNITY)
Admission: RE | Admit: 2022-01-11 | Discharge: 2022-01-11 | Disposition: A | Discharge status: Home / Self Care | Payer: BC Managed Care – PPO | Source: Ambulatory Visit | Attending: Radiology | Admitting: Radiology

## 2022-01-11 ENCOUNTER — Ambulatory Visit
Admission: RE | Admit: 2022-01-11 | Discharge: 2022-01-11 | Disposition: A | Discharge status: Home / Self Care | Payer: BC Managed Care – PPO | Source: Ambulatory Visit | Attending: Internal Medicine | Admitting: Internal Medicine

## 2022-01-11 DIAGNOSIS — E042 Nontoxic multinodular goiter: Secondary | ICD-10-CM | POA: Diagnosis present

## 2022-01-12 LAB — CYTOLOGY - NON PAP

## 2022-04-05 ENCOUNTER — Ambulatory Visit: Payer: BC Managed Care – PPO | Admitting: Internal Medicine

## 2022-05-05 ENCOUNTER — Ambulatory Visit: Payer: BC Managed Care – PPO | Admitting: Internal Medicine

## 2022-08-20 IMAGING — US US THYROID
1 series · 13 of 25 positions shown · non-contrast
Comparison: None available

CLINICAL DATA: Thyroid nodules, previous thyroid biopsy at an
outside facility

EXAM:
THYROID ULTRASOUND
TECHNIQUE: Ultrasound examination of the thyroid gland and adjacent soft
tissues was performed.

[Series 1: us thyroid · 0.06mm/px · 13 of 61 slices shown]
[im 1/61]
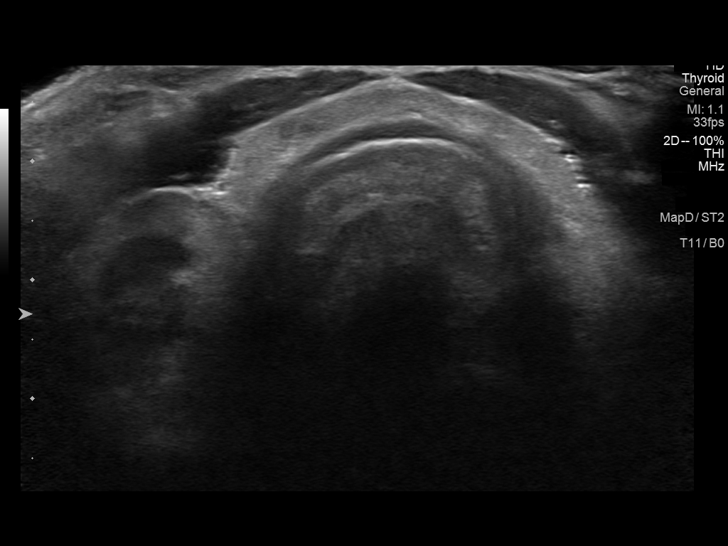
[im 6/61]
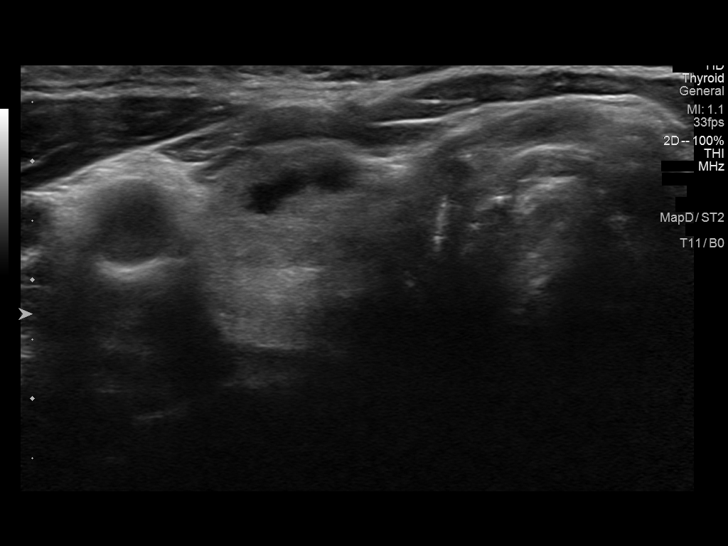
[im 11/61]
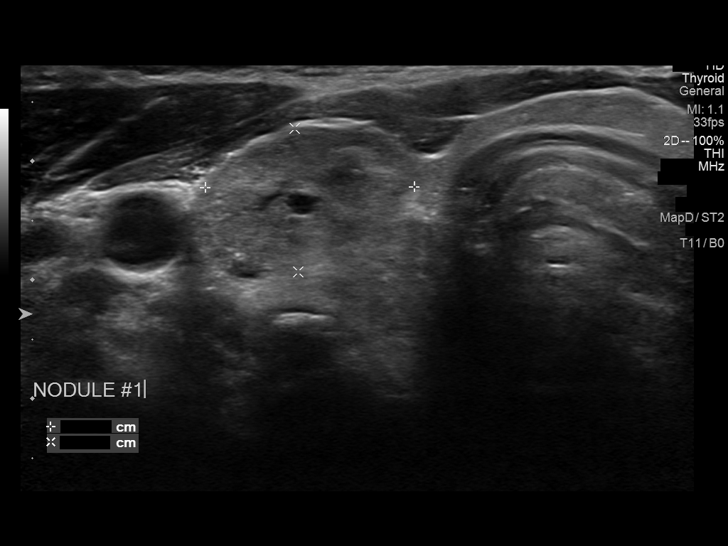
[im 16/61]
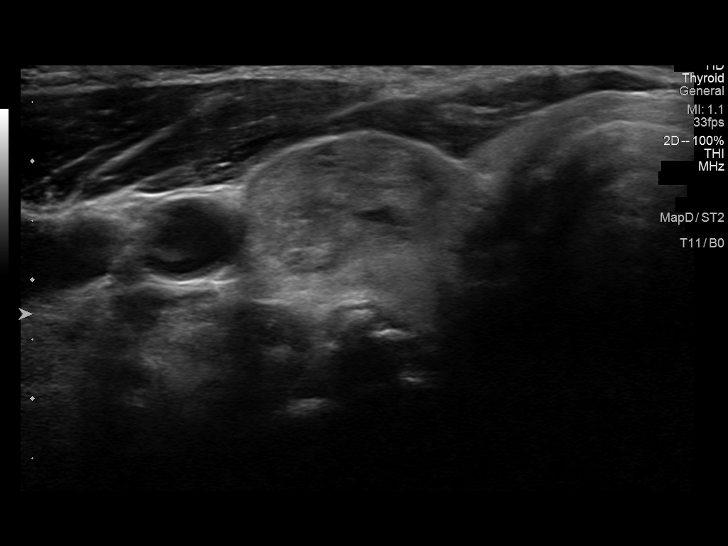
[im 21/61]
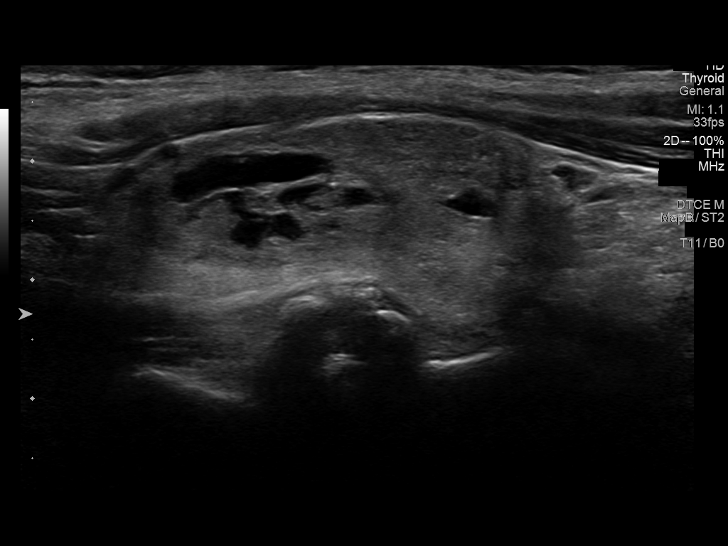
[im 26/61]
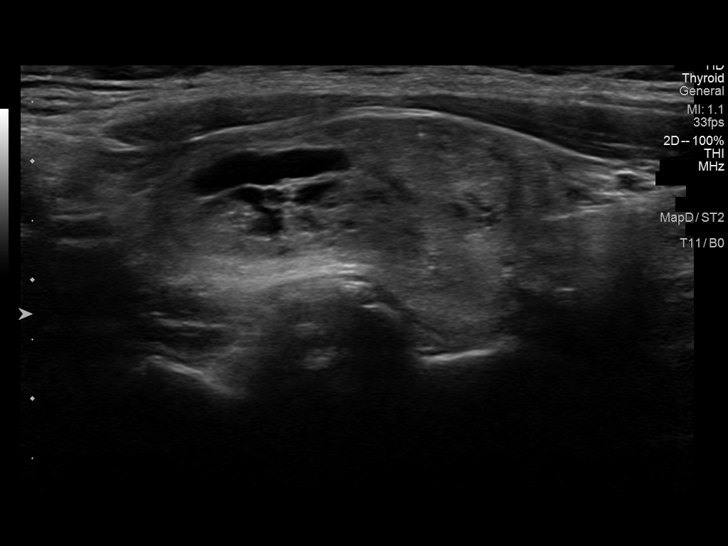
[im 31/61]
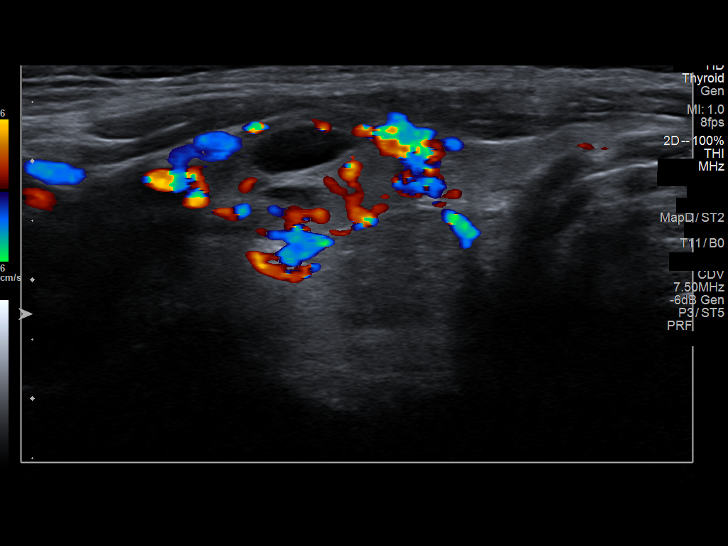
[im 36/61]
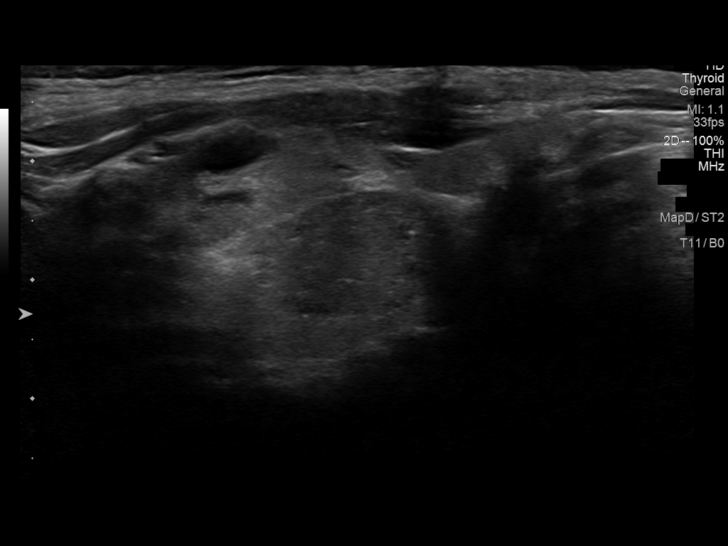
[im 41/61]
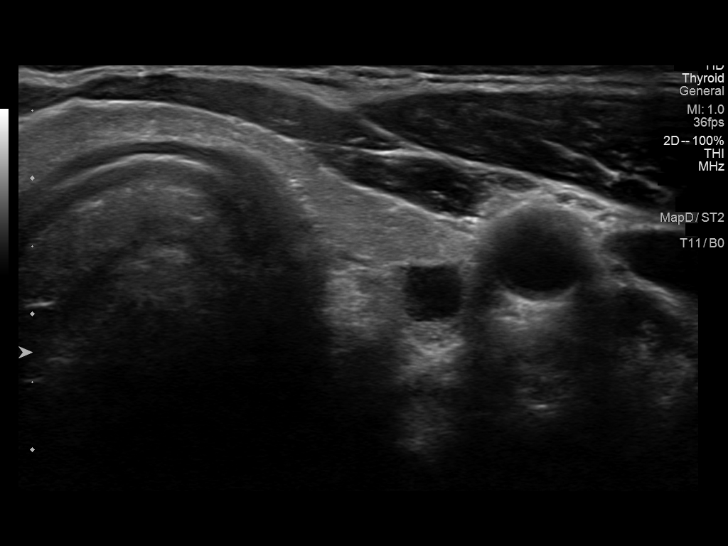
[im 46/61]
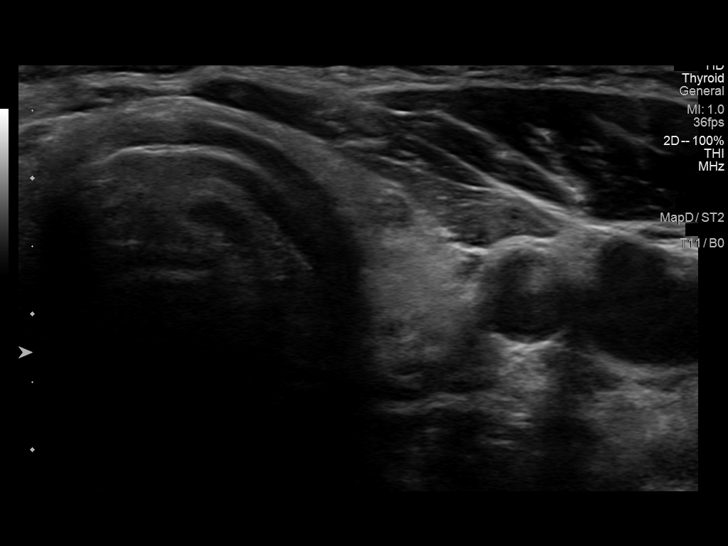
[im 51/61]
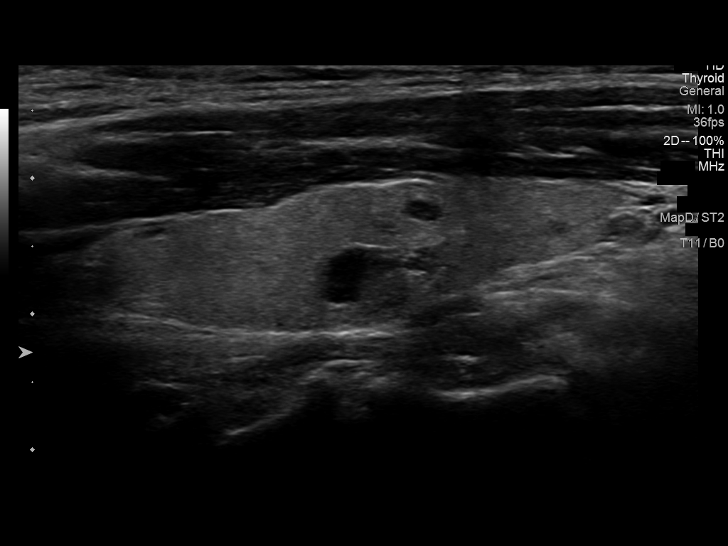
[im 56/61]
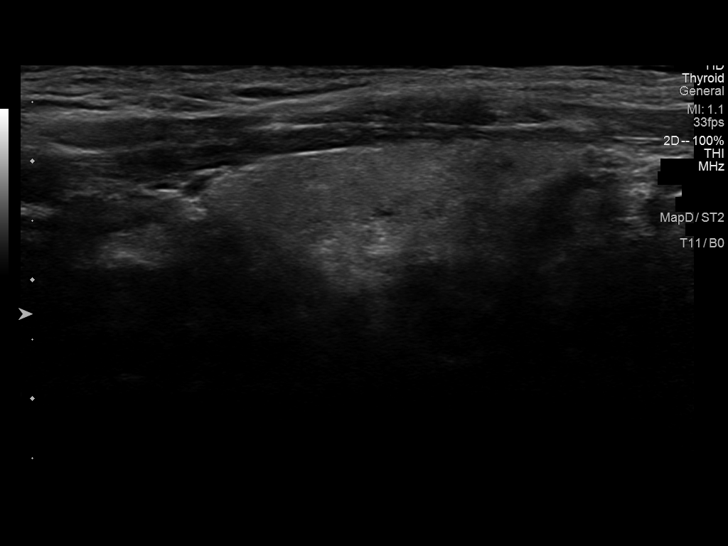
[im 61/61]
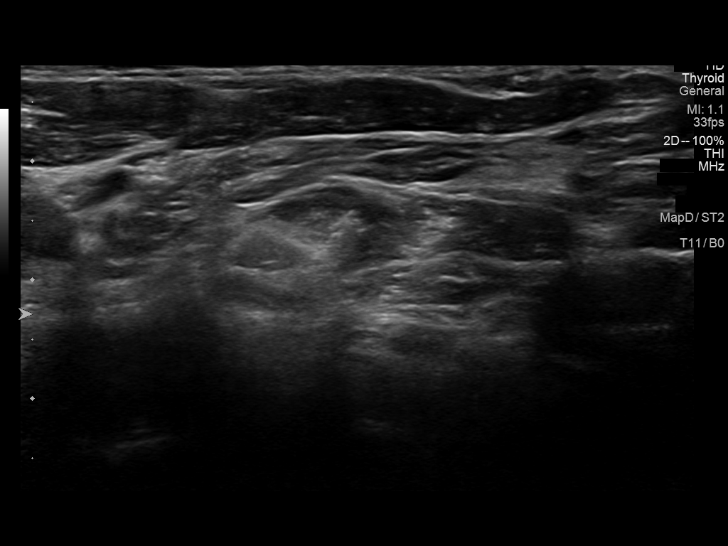

[13 of 25 positions shown; findings below may reference images not displayed]

FINDINGS: Parenchymal Echotexture: Mildly heterogenous

Isthmus: 4 mm

Right lobe: 3.7 x 1.8 x 2.0 cm

Left lobe: 4.1 x 1.0 x 1.2 cm

_________________________________________________________

Estimated total number of nodules >/= 1 cm: 2

Number of spongiform nodules >/=  2 cm not described below (TR1): 0

Number of mixed cystic and solid nodules >/= 1.5 cm not described
below (TR2): 0

_________________________________________________________

Nodule # 1:

Location: Right; Mid

Maximum size: 3.3 cm; Other 2 dimensions: 1.8 x 1.2 cm

Composition: mixed cystic and solid (1)

Echogenicity: isoechoic (1)

Shape: not taller-than-wide (0)

Margins: ill-defined (0)

Echogenic foci: none (0)

ACR TI-RADS total points: 2.

ACR TI-RADS risk category: TR2 (2 points).

ACR TI-RADS recommendations:

This nodule does NOT meet TI-RADS criteria for biopsy or dedicated
follow-up.

_________________________________________________________

Nodule # 2:

Location: Right; Inferior

Maximum size: 1.4 cm; Other 2 dimensions: 1.1 x 1.0 cm

Composition: solid/almost completely solid (2)

Echogenicity: hypoechoic (2)

Shape: not taller-than-wide (0)

Margins: smooth (0)

Echogenic foci: punctate echogenic foci (3)

ACR TI-RADS total points: 7.

ACR TI-RADS risk category: TR5 (>/= 7 points).

ACR TI-RADS recommendations:

**Given size (>/= 1.0 cm) and appearance, fine needle aspiration of
this highly suspicious nodule should be considered based on TI-RADS
criteria.

_________________________________________________________

Nodule 3 represents a subcentimeter benign cystic nodule in the left
mid thyroid measuring only 7 mm.

No hypervascularity.  No regional adenopathy.
IMPRESSION: 1.4 cm right inferior TR 5 nodule meets criteria for biopsy as
above.

3.3 cm right mid thyroid TR 2 nodule which does not meet criteria
for any biopsy or continued follow-up. Correlate with any prior
pathology.

The above is in keeping with the ACR TI-RADS recommendations - [HOSPITAL] 3514;[DATE].

## 2022-10-09 IMAGING — US US FNA BIOPSY THYROID 1ST LESION
1 series · 13 of 17 positions shown · non-contrast
Comparison: 11/22/21 US Thyroid.

MEDICATIONS:
5 cc 1% lidocaine

COMPLICATIONS:
None immediate.

INDICATION: Right inferior thyroid nodule

1.4 cm
EXAM:
ULTRASOUND GUIDED FINE NEEDLE ASPIRATION OF INDETERMINATE THYROID
NODULE
TECHNIQUE: Informed written consent was obtained from the patient after a
discussion of the risks, benefits and alternatives to treatment.
Questions regarding the procedure were encouraged and answered. A
timeout was performed prior to the initiation of the procedure.

[Series 1: us fna biopsy thyroid 1st lesion · 0.06mm/px · 17 acquisitions, 13 frames shown]
[im 1/17]
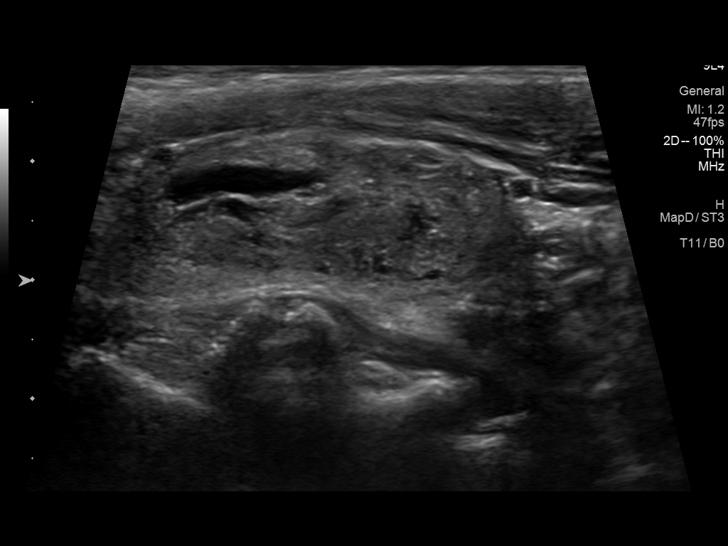
[im 2/17]
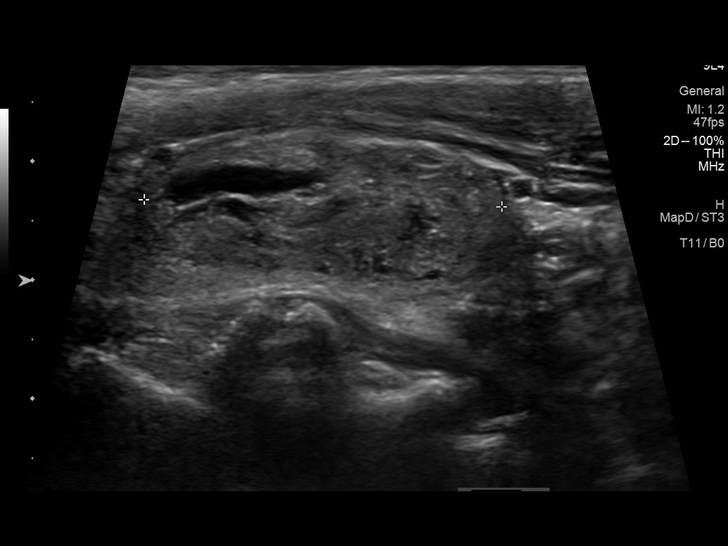
[im 4/17]
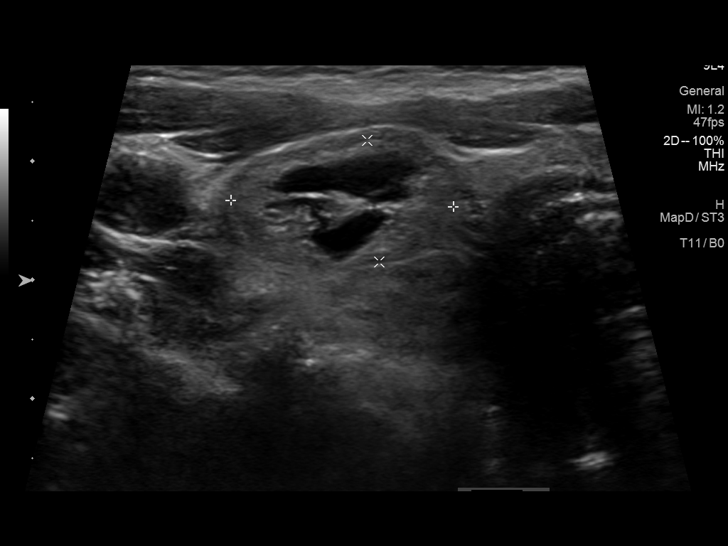
[im 5/17]
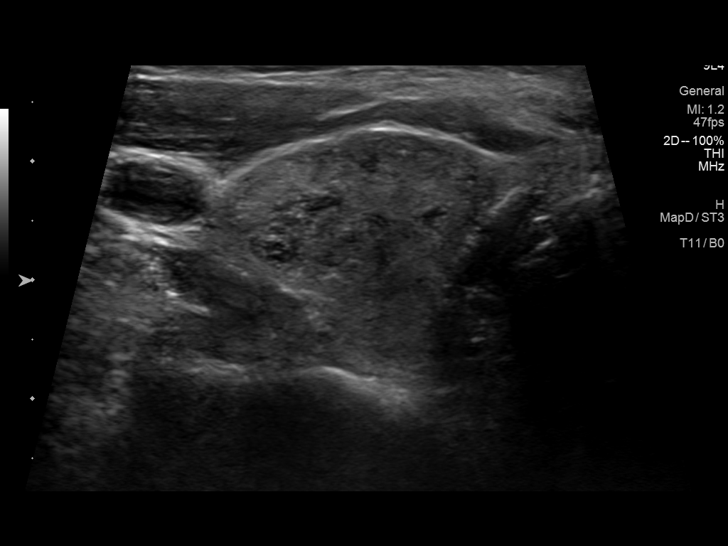
[im 6/17]
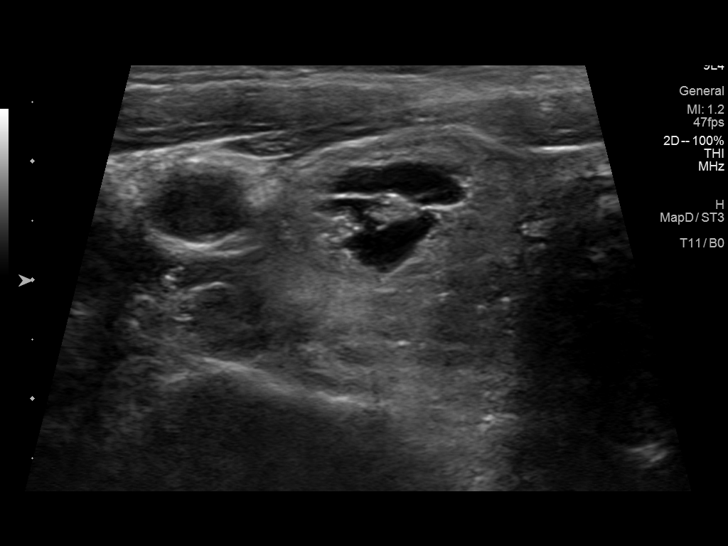
[im 8/17]
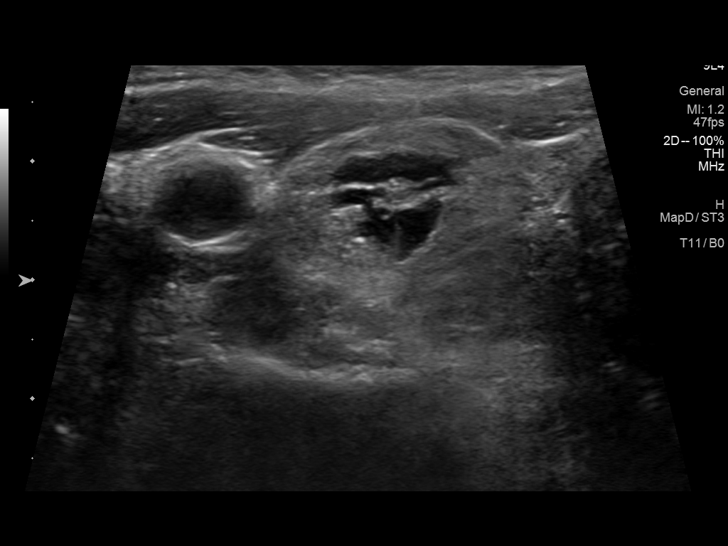
[im 9/17]
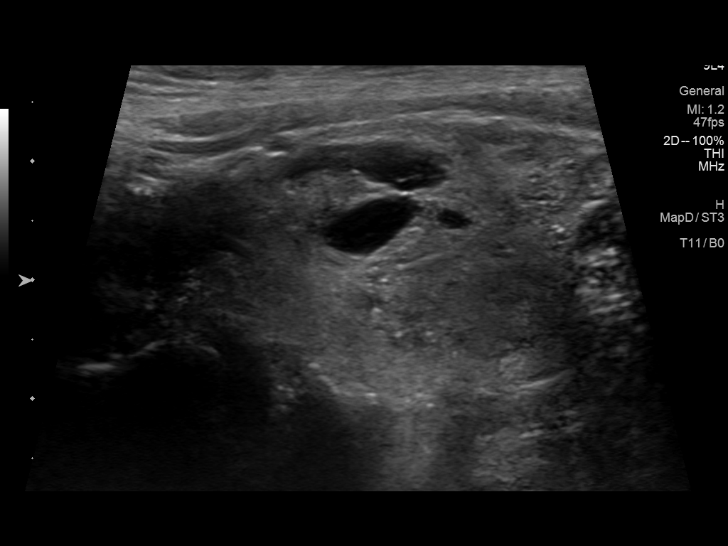
[im 10/17]
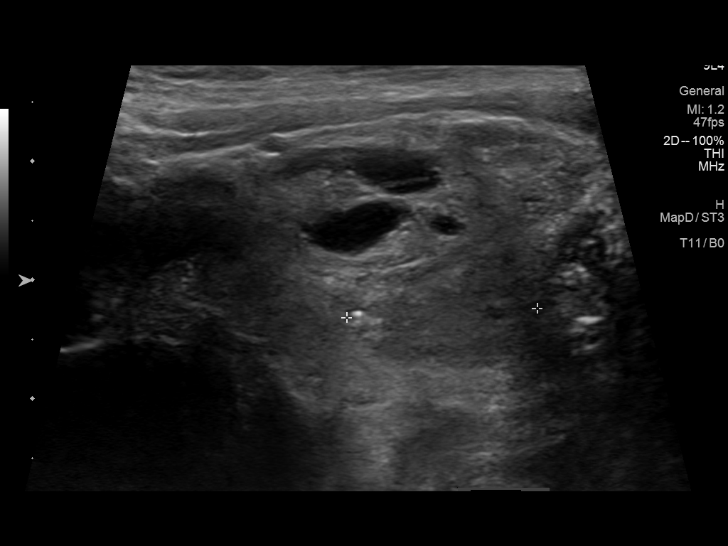
[im 12/17]
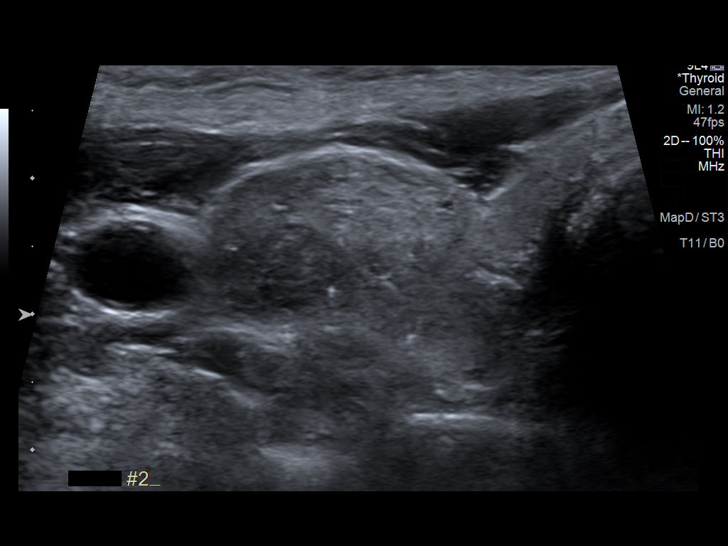
[im 13/17]
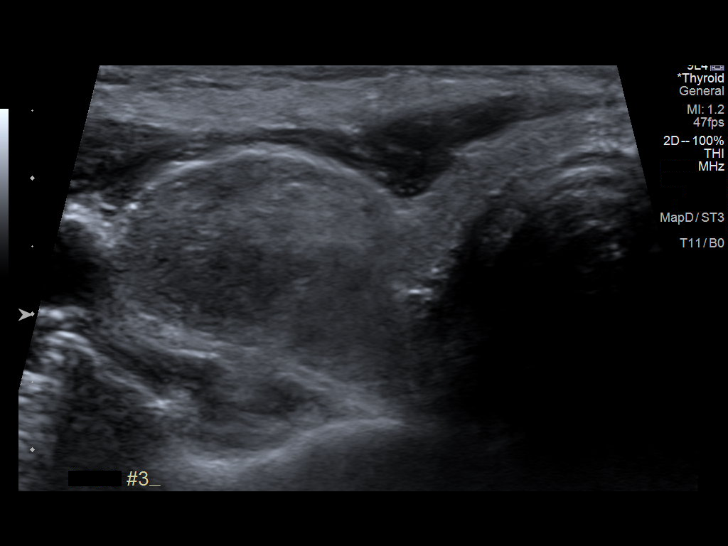
[im 14/17]
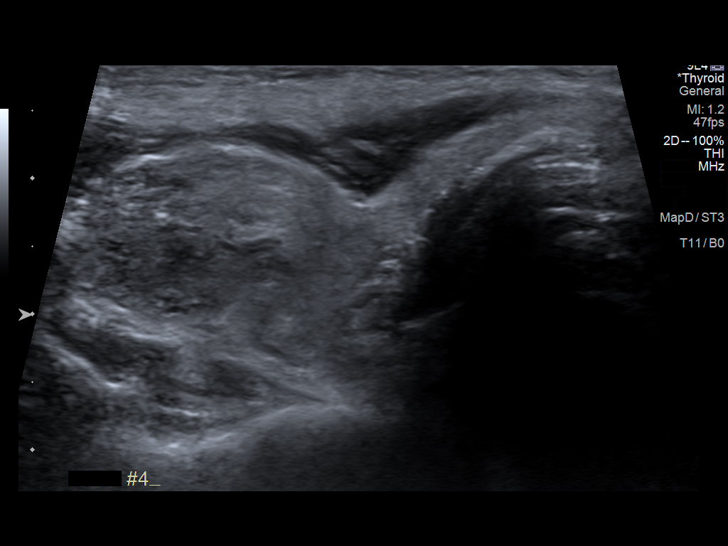
[im 16/17]
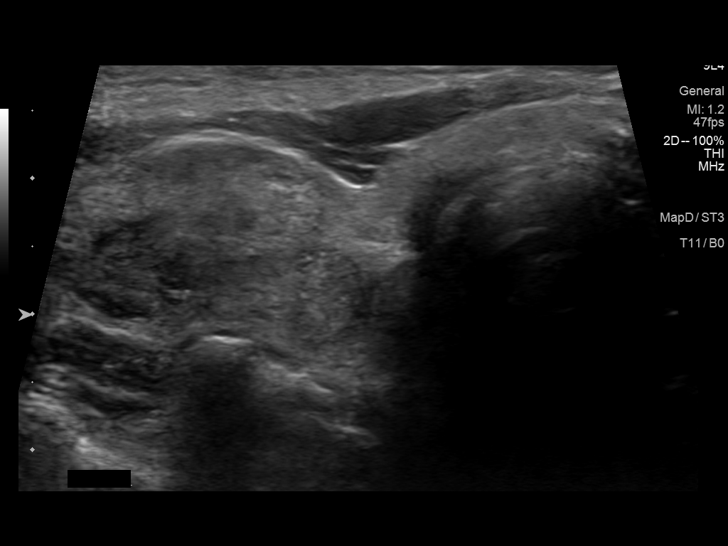
[im 17/17]
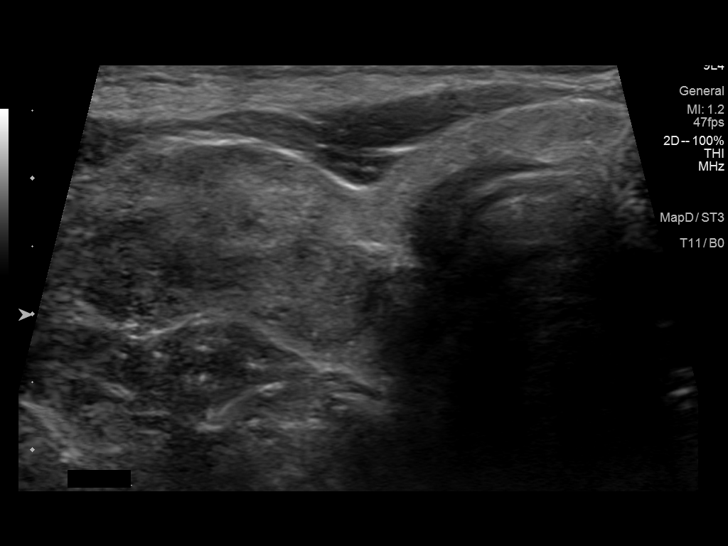

[13 of 17 positions shown; findings below may reference images not displayed]

Pre-procedural ultrasound scanning demonstrated unchanged size and
appearance of the indeterminate nodule within the right thyroid

The procedure was planned. The neck was prepped in the usual sterile
fashion, and a sterile drape was applied covering the operative
field. A timeout was performed prior to the initiation of the
procedure. Local anesthesia was provided with 1% lidocaine.

Under direct ultrasound guidance, 5 FNA biopsies were performed of
the right inferior thyroid nodule with a 27 gauge needle.

2 of these samples were obtained for AFIRMA

Multiple ultrasound images were saved for procedural documentation
purposes. The samples were prepared and submitted to pathology.

Limited post procedural scanning was negative for hematoma or
additional complication. Dressings were placed. The patient
tolerated the above procedures procedure well without immediate
postprocedural complication.
FINDINGS: Nodule reference number based on prior diagnostic ultrasound: 2

Maximum size: 1.4 cm

Location: Right; Inferior

ACR TI-RADS risk category: TR5 (>/= 7 points)

Reason for biopsy: meets ACR TI-RADS criteria

Ultrasound imaging confirms appropriate placement of the needles
within the thyroid nodule.
IMPRESSION: Technically successful ultrasound guided fine needle aspiration of
right inferior thyroid nodule.

Read by

Jandrei Louissaint

## 2024-04-19 DIAGNOSIS — B078 Other viral warts: Secondary | ICD-10-CM | POA: Diagnosis not present

## 2024-05-22 DIAGNOSIS — B078 Other viral warts: Secondary | ICD-10-CM | POA: Diagnosis not present

## 2024-05-22 DIAGNOSIS — D1801 Hemangioma of skin and subcutaneous tissue: Secondary | ICD-10-CM | POA: Diagnosis not present

## 2024-05-22 DIAGNOSIS — D485 Neoplasm of uncertain behavior of skin: Secondary | ICD-10-CM | POA: Diagnosis not present
